# Patient Record
Sex: Female | Born: 1960 | Race: White | Hispanic: No | Marital: Married | Smoking: Never smoker
Health system: Southern US, Community
[De-identification: ages and names within clinical notes are randomized; demographics above are authoritative.]

## PROBLEM LIST (undated history)

## (undated) DIAGNOSIS — E785 Hyperlipidemia, unspecified: Secondary | ICD-10-CM

## (undated) DIAGNOSIS — C801 Malignant (primary) neoplasm, unspecified: Secondary | ICD-10-CM

## (undated) DIAGNOSIS — E041 Nontoxic single thyroid nodule: Secondary | ICD-10-CM

## (undated) DIAGNOSIS — M779 Enthesopathy, unspecified: Secondary | ICD-10-CM

## (undated) DIAGNOSIS — M8580 Other specified disorders of bone density and structure, unspecified site: Secondary | ICD-10-CM

## (undated) DIAGNOSIS — E039 Hypothyroidism, unspecified: Secondary | ICD-10-CM

## (undated) HISTORY — DX: Enthesopathy, unspecified: M77.9

## (undated) HISTORY — DX: Hypothyroidism, unspecified: E03.9

## (undated) HISTORY — DX: Hyperlipidemia, unspecified: E78.5

## (undated) HISTORY — DX: Nontoxic single thyroid nodule: E04.1

## (undated) HISTORY — DX: Other specified disorders of bone density and structure, unspecified site: M85.80

## (undated) HISTORY — DX: Malignant (primary) neoplasm, unspecified: C80.1

---

## 1994-09-13 HISTORY — PX: LIPOSUCTION: SHX10

## 1999-07-29 ENCOUNTER — Other Ambulatory Visit: Admission: RE | Admit: 1999-07-29 | Discharge: 1999-07-29 | Payer: Self-pay | Admitting: Obstetrics and Gynecology

## 2000-07-14 ENCOUNTER — Other Ambulatory Visit: Admission: RE | Admit: 2000-07-14 | Discharge: 2000-07-14 | Payer: Self-pay | Admitting: Obstetrics and Gynecology

## 2001-05-22 ENCOUNTER — Encounter: Payer: Self-pay | Admitting: Obstetrics and Gynecology

## 2001-05-22 ENCOUNTER — Encounter: Admission: RE | Admit: 2001-05-22 | Discharge: 2001-05-22 | Payer: Self-pay | Admitting: Obstetrics and Gynecology

## 2001-08-08 ENCOUNTER — Other Ambulatory Visit: Admission: RE | Admit: 2001-08-08 | Discharge: 2001-08-08 | Payer: Self-pay | Admitting: Obstetrics and Gynecology

## 2002-05-01 ENCOUNTER — Encounter: Admission: RE | Admit: 2002-05-01 | Discharge: 2002-05-01 | Payer: Self-pay | Admitting: Obstetrics and Gynecology

## 2002-05-01 ENCOUNTER — Encounter: Payer: Self-pay | Admitting: Obstetrics and Gynecology

## 2002-07-30 ENCOUNTER — Other Ambulatory Visit: Admission: RE | Admit: 2002-07-30 | Discharge: 2002-07-30 | Payer: Self-pay | Admitting: Obstetrics and Gynecology

## 2003-05-10 ENCOUNTER — Encounter: Admission: RE | Admit: 2003-05-10 | Discharge: 2003-05-10 | Payer: Self-pay | Admitting: Obstetrics and Gynecology

## 2003-05-10 ENCOUNTER — Encounter: Payer: Self-pay | Admitting: Obstetrics and Gynecology

## 2003-08-02 ENCOUNTER — Other Ambulatory Visit: Admission: RE | Admit: 2003-08-02 | Discharge: 2003-08-02 | Payer: Self-pay | Admitting: Obstetrics and Gynecology

## 2003-09-14 DIAGNOSIS — C801 Malignant (primary) neoplasm, unspecified: Secondary | ICD-10-CM

## 2003-09-14 HISTORY — PX: BASAL CELL CARCINOMA EXCISION: SHX1214

## 2003-09-14 HISTORY — DX: Malignant (primary) neoplasm, unspecified: C80.1

## 2004-05-15 ENCOUNTER — Encounter: Admission: RE | Admit: 2004-05-15 | Discharge: 2004-05-15 | Payer: Self-pay | Admitting: Obstetrics and Gynecology

## 2004-08-14 ENCOUNTER — Other Ambulatory Visit: Admission: RE | Admit: 2004-08-14 | Discharge: 2004-08-14 | Payer: Self-pay | Admitting: Obstetrics and Gynecology

## 2005-05-25 ENCOUNTER — Encounter: Admission: RE | Admit: 2005-05-25 | Discharge: 2005-05-25 | Payer: Self-pay | Admitting: Obstetrics and Gynecology

## 2005-08-18 ENCOUNTER — Other Ambulatory Visit: Admission: RE | Admit: 2005-08-18 | Discharge: 2005-08-18 | Payer: Self-pay | Admitting: Obstetrics and Gynecology

## 2006-06-01 ENCOUNTER — Encounter: Admission: RE | Admit: 2006-06-01 | Discharge: 2006-06-01 | Payer: Self-pay | Admitting: Obstetrics and Gynecology

## 2006-09-23 ENCOUNTER — Other Ambulatory Visit: Admission: RE | Admit: 2006-09-23 | Discharge: 2006-09-23 | Payer: Self-pay | Admitting: Obstetrics and Gynecology

## 2007-06-09 ENCOUNTER — Encounter: Admission: RE | Admit: 2007-06-09 | Discharge: 2007-06-09 | Payer: Self-pay | Admitting: Obstetrics and Gynecology

## 2007-10-11 ENCOUNTER — Other Ambulatory Visit: Admission: RE | Admit: 2007-10-11 | Discharge: 2007-10-11 | Payer: Self-pay | Admitting: Obstetrics and Gynecology

## 2008-06-14 ENCOUNTER — Encounter: Admission: RE | Admit: 2008-06-14 | Discharge: 2008-06-14 | Payer: Self-pay | Admitting: Obstetrics and Gynecology

## 2008-06-21 ENCOUNTER — Encounter: Admission: RE | Admit: 2008-06-21 | Discharge: 2008-06-21 | Payer: Self-pay | Admitting: Obstetrics and Gynecology

## 2008-10-22 ENCOUNTER — Other Ambulatory Visit: Admission: RE | Admit: 2008-10-22 | Discharge: 2008-10-22 | Payer: Self-pay | Admitting: Obstetrics & Gynecology

## 2008-12-27 ENCOUNTER — Encounter: Admission: RE | Admit: 2008-12-27 | Discharge: 2008-12-27 | Payer: Self-pay | Admitting: Obstetrics and Gynecology

## 2009-07-02 ENCOUNTER — Encounter: Admission: RE | Admit: 2009-07-02 | Discharge: 2009-07-02 | Payer: Self-pay | Admitting: Obstetrics and Gynecology

## 2010-07-08 ENCOUNTER — Encounter: Admission: RE | Admit: 2010-07-08 | Discharge: 2010-07-08 | Payer: Self-pay | Admitting: Obstetrics and Gynecology

## 2011-04-26 ENCOUNTER — Other Ambulatory Visit: Payer: Self-pay | Admitting: *Deleted

## 2011-04-26 ENCOUNTER — Other Ambulatory Visit: Payer: Self-pay | Admitting: Obstetrics and Gynecology

## 2011-04-26 DIAGNOSIS — Z1231 Encounter for screening mammogram for malignant neoplasm of breast: Secondary | ICD-10-CM

## 2011-07-12 ENCOUNTER — Ambulatory Visit
Admission: RE | Admit: 2011-07-12 | Discharge: 2011-07-12 | Disposition: A | Discharge status: Home / Self Care | Payer: BC Managed Care – PPO | Source: Ambulatory Visit | Attending: Obstetrics and Gynecology | Admitting: Obstetrics and Gynecology

## 2011-07-12 DIAGNOSIS — Z1231 Encounter for screening mammogram for malignant neoplasm of breast: Secondary | ICD-10-CM

## 2012-06-08 ENCOUNTER — Other Ambulatory Visit: Payer: Self-pay | Admitting: Obstetrics and Gynecology

## 2012-06-08 DIAGNOSIS — Z1231 Encounter for screening mammogram for malignant neoplasm of breast: Secondary | ICD-10-CM

## 2012-07-11 ENCOUNTER — Other Ambulatory Visit: Payer: Self-pay | Admitting: *Deleted

## 2012-07-11 DIAGNOSIS — Z780 Asymptomatic menopausal state: Secondary | ICD-10-CM

## 2012-07-11 DIAGNOSIS — M2550 Pain in unspecified joint: Secondary | ICD-10-CM

## 2012-07-11 DIAGNOSIS — E2839 Other primary ovarian failure: Secondary | ICD-10-CM

## 2012-07-14 ENCOUNTER — Ambulatory Visit
Admission: RE | Admit: 2012-07-14 | Discharge: 2012-07-14 | Disposition: A | Discharge status: Home / Self Care | Payer: BC Managed Care – PPO | Source: Ambulatory Visit | Attending: Obstetrics and Gynecology | Admitting: Obstetrics and Gynecology

## 2012-07-14 DIAGNOSIS — Z1231 Encounter for screening mammogram for malignant neoplasm of breast: Secondary | ICD-10-CM

## 2013-06-25 ENCOUNTER — Other Ambulatory Visit: Payer: Self-pay

## 2013-06-25 DIAGNOSIS — Z1231 Encounter for screening mammogram for malignant neoplasm of breast: Secondary | ICD-10-CM

## 2013-08-03 ENCOUNTER — Ambulatory Visit
Admission: RE | Admit: 2013-08-03 | Discharge: 2013-08-03 | Disposition: A | Discharge status: Home / Self Care | Payer: BC Managed Care – PPO | Source: Ambulatory Visit

## 2013-08-03 DIAGNOSIS — Z1231 Encounter for screening mammogram for malignant neoplasm of breast: Secondary | ICD-10-CM

## 2013-08-07 ENCOUNTER — Other Ambulatory Visit: Payer: Self-pay | Admitting: Obstetrics and Gynecology

## 2013-08-07 DIAGNOSIS — R928 Other abnormal and inconclusive findings on diagnostic imaging of breast: Secondary | ICD-10-CM

## 2013-08-23 ENCOUNTER — Ambulatory Visit
Admission: RE | Admit: 2013-08-23 | Discharge: 2013-08-23 | Disposition: A | Discharge status: Home / Self Care | Payer: BC Managed Care – PPO | Source: Ambulatory Visit | Attending: Obstetrics and Gynecology | Admitting: Obstetrics and Gynecology

## 2013-08-23 ENCOUNTER — Other Ambulatory Visit: Payer: Self-pay | Admitting: Obstetrics and Gynecology

## 2013-08-23 DIAGNOSIS — R928 Other abnormal and inconclusive findings on diagnostic imaging of breast: Secondary | ICD-10-CM

## 2013-11-07 ENCOUNTER — Encounter: Payer: Self-pay | Admitting: Certified Nurse Midwife

## 2013-11-13 ENCOUNTER — Ambulatory Visit (INDEPENDENT_AMBULATORY_CARE_PROVIDER_SITE_OTHER): Payer: BC Managed Care – PPO | Admitting: Certified Nurse Midwife

## 2013-11-13 ENCOUNTER — Encounter: Payer: Self-pay | Admitting: Certified Nurse Midwife

## 2013-11-13 VITALS — BP 126/82 | HR 65 | Resp 18 | Ht 61.0 in | Wt 150.0 lb

## 2013-11-13 DIAGNOSIS — Z0000 Encounter for general adult medical examination without abnormal findings: Secondary | ICD-10-CM

## 2013-11-13 DIAGNOSIS — Z01419 Encounter for gynecological examination (general) (routine) without abnormal findings: Secondary | ICD-10-CM

## 2013-11-13 DIAGNOSIS — E039 Hypothyroidism, unspecified: Secondary | ICD-10-CM | POA: Insufficient documentation

## 2013-11-13 LAB — POCT URINALYSIS DIPSTICK
BILIRUBIN UA: NEGATIVE
GLUCOSE UA: NEGATIVE
Ketones, UA: NEGATIVE
Nitrite, UA: NEGATIVE
PROTEIN UA: NEGATIVE
RBC UA: NEGATIVE
UROBILINOGEN UA: NEGATIVE
pH, UA: 5

## 2013-11-13 NOTE — Progress Notes (Signed)
Reviewed personally.  M. Suzanne Josee Speece, MD.  

## 2013-11-13 NOTE — Patient Instructions (Signed)

## 2013-11-13 NOTE — Progress Notes (Signed)
53 y.o. G0P0000 Married Caucasian Fe here for annual exam. Menopausal no HRT. Denies vaginal bleeding or vaginal dryness. Recent labs with elevated Cholesterol , patient to have  Fasting done with PCP management soon. Patients brother diagnosed with rare Pancreatic/ and  Colon cancer, in treatment now. Patient doing "OK" emotionally. No health issues today.  No LMP recorded. Patient is postmenopausal.          Sexually active: no  The current method of family planning is none.    Exercising: yes  Running, walking, weights Smoker:  no  Health Maintenance: Pap:  10/2011 - normal MMG:  07/2013 normal after follow up on right negative  Density  Category B  Colonoscopy:  04/2011 - Going back this year - Polyps  BMD:   Never TDaP:  01/2013 Labs: PCP   reports that she has never smoked. She has never used smokeless tobacco. She reports that she drinks about 8.4 ounces of alcohol per week. She reports that she does not use illicit drugs.  Past Medical History  Diagnosis Date  . Cancer 2005    basal cell upper back    Past Surgical History  Procedure Laterality Date  . Liposuction  1996    hips  . Basal cell carcinoma excision  2005    left side upper back    Current Outpatient Prescriptions  Medication Sig Dispense Refill  . CALCIUM PO Take 600 mg by mouth daily.       . IRON PO Take by mouth daily.      Marland Kitchen levothyroxine (SYNTHROID, LEVOTHROID) 88 MCG tablet Take 1 tablet by mouth daily.      . Multiple Vitamins-Minerals (HAIR/SKIN/NAILS/BIOTIN PO) Take by mouth daily.      . Multiple Vitamins-Minerals (MULTIVITAMIN PO) Take by mouth daily.      Marland Kitchen triamterene-hydrochlorothiazide (MAXZIDE-25) 37.5-25 MG per tablet Take 1 tablet by mouth daily.      . Echinacea 400 MG CAPS Take by mouth as needed.       No current facility-administered medications for this visit.    Family History  Problem Relation Age of Onset  . Heart disease Mother     bypass  . Cancer Father     Prostate   . Cancer Brother     Brain, Prostate  . Cancer Brother     Liver and Colon    ROS:  Pertinent items are noted in HPI.  Otherwise, a comprehensive ROS was negative.  Exam:   BP 126/82  Pulse 65  Resp 18  Ht 5\' 1"  (1.549 m)  Wt 150 lb (68.04 kg)  BMI 28.36 kg/m2 Height: 5\' 1"  (154.9 cm)  Ht Readings from Last 3 Encounters:  11/13/13 5\' 1"  (1.549 m)    General appearance: alert, cooperative and appears stated age Head: Normocephalic, without obvious abnormality, atraumatic Neck: no adenopathy, supple, symmetrical, trachea midline and thyroid normal to inspection and palpation and non-palpable Lungs: clear to auscultation bilaterally Breasts: normal appearance, no masses or tenderness, No nipple retraction or dimpling, No nipple discharge or bleeding, No axillary or supraclavicular adenopathy Heart: regular rate and rhythm Abdomen: soft, non-tender; no masses,  no organomegaly Extremities: extremities normal, atraumatic, no cyanosis or edema Skin: Skin color, texture, turgor normal. No rashes or lesions Lymph nodes: Cervical, supraclavicular, and axillary nodes normal. No abnormal inguinal nodes palpated Neurologic: Grossly normal   Pelvic: External genitalia:  no lesions              Urethra:  normal  appearing urethra with no masses, tenderness or lesions              Bartholin's and Skene's: normal                 Vagina: normal appearing vagina with normal color and discharge, no lesions              Cervix: normal appearance, non tender              Pap taken: yes Bimanual Exam:  Uterus:  normal size, contour, position, consistency, mobility, non-tender and mid position              Adnexa: normal adnexa and no mass, fullness, tenderness               Rectovaginal: Confirms               Anus:  normal sphincter tone, no lesions  A:  Well Woman with normal exam  Menopausal no HRT  Social Stress with brothers cancer  Hypothyroid stable medication with PCP  management  P:   Reviewed health and wellness pertinent to exam  Aware of need to evaluate if vaginal bleeding  Discussed seeking family and friend support  Continue follow up as indciated  Pap smear as per guidelines   Mammogram yearly pap smear taken today with reflex  counseled on breast self exam, mammography screening, adequate intake of calcium and vitamin D, diet and exercise Colonoscopy due this year due to polyps, has scheduled return annually or prn  An After Visit Summary was printed and given to the patient.

## 2013-11-15 LAB — IPS PAP TEST WITH REFLEX TO HPV

## 2014-07-19 ENCOUNTER — Other Ambulatory Visit: Payer: Self-pay

## 2014-07-19 DIAGNOSIS — Z1231 Encounter for screening mammogram for malignant neoplasm of breast: Secondary | ICD-10-CM

## 2014-08-16 ENCOUNTER — Encounter (INDEPENDENT_AMBULATORY_CARE_PROVIDER_SITE_OTHER): Payer: Self-pay

## 2014-08-16 ENCOUNTER — Ambulatory Visit
Admission: RE | Admit: 2014-08-16 | Discharge: 2014-08-16 | Disposition: A | Discharge status: Home / Self Care | Payer: BC Managed Care – PPO | Source: Ambulatory Visit

## 2014-08-16 ENCOUNTER — Ambulatory Visit: Payer: BC Managed Care – PPO

## 2014-08-16 DIAGNOSIS — Z1231 Encounter for screening mammogram for malignant neoplasm of breast: Secondary | ICD-10-CM

## 2014-11-15 ENCOUNTER — Encounter: Payer: Self-pay | Admitting: Certified Nurse Midwife

## 2014-11-15 ENCOUNTER — Ambulatory Visit (INDEPENDENT_AMBULATORY_CARE_PROVIDER_SITE_OTHER): Payer: BC Managed Care – PPO | Admitting: Certified Nurse Midwife

## 2014-11-15 VITALS — BP 120/78 | HR 70 | Resp 16 | Ht 60.5 in | Wt 152.0 lb

## 2014-11-15 DIAGNOSIS — Z01419 Encounter for gynecological examination (general) (routine) without abnormal findings: Secondary | ICD-10-CM | POA: Diagnosis not present

## 2014-11-15 DIAGNOSIS — Z0000 Encounter for general adult medical examination without abnormal findings: Secondary | ICD-10-CM | POA: Diagnosis not present

## 2014-11-15 LAB — POCT URINALYSIS DIPSTICK
BILIRUBIN UA: NEGATIVE
Blood, UA: NEGATIVE
GLUCOSE UA: NEGATIVE
KETONES UA: NEGATIVE
Leukocytes, UA: NEGATIVE
Nitrite, UA: NEGATIVE
Protein, UA: NEGATIVE
Urobilinogen, UA: NEGATIVE
pH, UA: 5

## 2014-11-15 NOTE — Patient Instructions (Signed)

## 2014-11-15 NOTE — Progress Notes (Signed)
54 y.o. G0P0000 Married  Caucasian Fe here for annual exam. Menopausal no HRT. Denies vaginal bleeding/vaginal dryness. Sees PCP for aex/labs and Hypothyroid management. Social stress with mother diagnosis with cancer. She has now moved her into her home. Has sibling support and church support. No other health issues.  Patient's last menstrual period was 01/12/2011.          Sexually active: No.  The current method of family planning is post menopausal status.    Exercising: Yes.    walking,run,weights,yoga Smoker:  no  Health Maintenance: Pap:  11-13-13 neg MMG: 08-16-14 category b density,birads 1:neg Colonoscopy: 9/15 f/u 55yrs per patient BMD:   none TDaP:  5/14 Labs: Poct urine-neg Self breast exam: done monthly   reports that she has never smoked. She has never used smokeless tobacco. She reports that she drinks about 4.8 oz of alcohol per week. She reports that she does not use illicit drugs.  Past Medical History  Diagnosis Date  . Cancer 2005    basal cell upper back    Past Surgical History  Procedure Laterality Date  . Liposuction  1996    hips  . Basal cell carcinoma excision  2005    left side upper back    Current Outpatient Prescriptions  Medication Sig Dispense Refill  . levothyroxine (SYNTHROID, LEVOTHROID) 88 MCG tablet Take 1 tablet by mouth daily.    . Multiple Vitamins-Minerals (HAIR/SKIN/NAILS/BIOTIN PO) Take by mouth daily.    . Multiple Vitamins-Minerals (MULTIVITAMIN PO) Take by mouth daily.    Marland Kitchen triamterene-hydrochlorothiazide (MAXZIDE-25) 37.5-25 MG per tablet Take 1 tablet by mouth as needed.      No current facility-administered medications for this visit.    Family History  Problem Relation Age of Onset  . Heart disease Mother     bypass  . Cancer Father     Prostate  . Cancer Brother     Brain, Prostate  . Cancer Brother     Liver and Colon    ROS:  Pertinent items are noted in HPI.  Otherwise, a comprehensive ROS was  negative.  Exam:   BP 120/78 mmHg  Pulse 70  Resp 16  Ht 5' 0.5" (1.537 m)  Wt 152 lb (68.947 kg)  BMI 29.19 kg/m2  LMP 01/12/2011 Height: 5' 0.5" (153.7 cm) Ht Readings from Last 3 Encounters:  11/15/14 5' 0.5" (1.537 m)  11/13/13 5\' 1"  (1.549 m)    General appearance: alert, cooperative and appears stated age Head: Normocephalic, without obvious abnormality, atraumatic Neck: no adenopathy, supple, symmetrical, trachea midline and thyroid normal to inspection and palpation Lungs: clear to auscultation bilaterally Breasts: normal appearance, no masses or tenderness, No nipple retraction or dimpling, No nipple discharge or bleeding, No axillary or supraclavicular adenopathy Heart: regular rate and rhythm Abdomen: soft, non-tender; no masses,  no organomegaly Extremities: extremities normal, atraumatic, no cyanosis or edema Skin: Skin color, texture, turgor normal. No rashes or lesions Lymph nodes: Cervical, supraclavicular, and axillary nodes normal. No abnormal inguinal nodes palpated Neurologic: Grossly normal   Pelvic: External genitalia:  no lesions              Urethra:  normal appearing urethra with no masses, tenderness or lesions              Bartholin's and Skene's: normal                 Vagina: normal appearing vagina with normal color and discharge, no lesions  Cervix: normal, non tender, no lesions              Pap taken: No. Bimanual Exam:  Uterus:  normal size, contour, position, consistency, mobility, non-tender              Adnexa: normal adnexa and no mass, fullness, tenderness               Rectovaginal: Confirms               Anus:  normal sphincter tone, no lesions  Chaperone present: Yes  A:  Well Woman with normal exam  Menopausal no HRT  Hypothyroid with PCP management  BMD due  Family history of colon cancer patient colonoscopy negative  Social stress with mother with cancer colon  P:   Reviewed health and wellness pertinent to  exam  Aware of need to evaluate if vaginal bleeding  Continue follow up with MD as recommended  Discussed having bone density this year with now 5 years out from menopause and hypothyroid. Patient will call and schedule. Discussed importance of exercise and and Vitamin D( checked at PCP).  Stressed time for self while caring for mother and seek support from family as needed.  Pap smear not taken today   counseled on breast self exam, mammography screening, menopause, adequate intake of calcium and vitamin D, diet and exercise  return annually or prn  An After Visit Summary was printed and given to the patient.

## 2014-11-19 NOTE — Progress Notes (Signed)
Reviewed personally.  M. Suzanne Winferd Wease, MD.  

## 2015-06-23 ENCOUNTER — Other Ambulatory Visit: Payer: Self-pay

## 2015-06-23 DIAGNOSIS — Z1231 Encounter for screening mammogram for malignant neoplasm of breast: Secondary | ICD-10-CM

## 2015-08-18 ENCOUNTER — Ambulatory Visit
Admission: RE | Admit: 2015-08-18 | Discharge: 2015-08-18 | Disposition: A | Discharge status: Home / Self Care | Payer: BC Managed Care – PPO | Source: Ambulatory Visit

## 2015-08-18 DIAGNOSIS — Z1231 Encounter for screening mammogram for malignant neoplasm of breast: Secondary | ICD-10-CM

## 2015-11-18 ENCOUNTER — Ambulatory Visit (INDEPENDENT_AMBULATORY_CARE_PROVIDER_SITE_OTHER): Payer: BC Managed Care – PPO | Admitting: Certified Nurse Midwife

## 2015-11-18 ENCOUNTER — Encounter: Payer: Self-pay | Admitting: Certified Nurse Midwife

## 2015-11-18 VITALS — BP 112/80 | HR 70 | Resp 16 | Ht 60.25 in | Wt 152.0 lb

## 2015-11-18 DIAGNOSIS — Z01419 Encounter for gynecological examination (general) (routine) without abnormal findings: Secondary | ICD-10-CM | POA: Diagnosis not present

## 2015-11-18 DIAGNOSIS — Z124 Encounter for screening for malignant neoplasm of cervix: Secondary | ICD-10-CM | POA: Diagnosis not present

## 2015-11-18 NOTE — Patient Instructions (Signed)

## 2015-11-18 NOTE — Progress Notes (Signed)
55 y.o. G0P0000 Married  Caucasian Fe here for annual exam. Menopausal no HRT. Denies vaginal bleeding or vaginal dryness.  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 Is treating with Yoga and proper stance. Sees PCP yearly aex/labs/hypothyroid management. No other health issues today.  Patient's last menstrual period was 01/12/2011.          Sexually active: No.  The current method of family planning is none, post menopausal    Exercising: Yes.    walking, stairclimber, simply fit board,yoga Smoker:  no  Health Maintenance: Pap:  11-13-13 neg MMG:  08-18-15 category b density,birads 1:neg Colonoscopy: 9/15 f/u 70yrs BMD:   none TDaP:  5/14 Shingles: not done Pneumonia: not done2 Hep C and HIV: will do at PCP Labs:pcp  Self breast exam: done occ   reports that she has never smoked. She has never used smokeless tobacco. She reports that she drinks about 4.8 - 6.0 oz of alcohol per week. She reports that she does not use illicit drugs.  Past Medical History  Diagnosis Date  . Cancer ([place]) 2005    basal cell upper back    Past Surgical History  Procedure Laterality Date  . Liposuction  1996    hips  . Basal cell carcinoma excision  2005    left side upper back    Current Outpatient Prescriptions  Medication Sig Dispense Refill  . levothyroxine (SYNTHROID, LEVOTHROID) 88 MCG tablet Take 1 tablet by mouth daily.    . Multiple Vitamins-Minerals (HAIR/SKIN/NAILS/BIOTIN PO) Take by mouth daily.    . Multiple Vitamins-Minerals (MULTIVITAMIN PO) Take by mouth daily.    Marland Kitchen UNABLE TO FIND [facility] generic diuretic every other day    . triamterene-hydrochlorothiazide (MAXZIDE-25) 37.5-25 MG per tablet Take 1 tablet by mouth as needed. Reported on 11/18/2015     No current facility-administered medications for this visit.    Family History  Problem Relation Age of Onset  . Heart disease Mother     bypass  . Stroke Mother     minor stroke 08-19-14  . Cancer Father      Prostate  . Cancer Brother     Brain, Prostate  . Cancer Brother     Liver and Colon    ROS:  Pertinent items are noted in HPI.  Otherwise, a comprehensive ROS was negative.  Exam:   BP 112/80 mmHg  Pulse 70  Resp 16  Ht 5' 0.25" (1.53 m)  Wt 152 lb (68.947 kg)  BMI 29.45 kg/m2  LMP 01/12/2011 Height: 5' 0.25" (153 cm) Ht Readings from Last 3 Encounters:  11/18/15 5' 0.25" (1.53 m)  11/15/14 5' 0.5" (1.537 m)  11/13/13 5\' 1"  (1.549 m)    General appearance: alert, cooperative and appears stated age Head: Normocephalic, without obvious abnormality, atraumatic Neck: no adenopathy, supple, symmetrical, trachea midline and thyroid normal to inspection and palpation Lungs: clear to auscultation bilaterally Breasts: normal appearance, no masses or tenderness, No nipple retraction or dimpling, No nipple discharge or bleeding, No axillary or supraclavicular adenopathy Heart: regular rate and rhythm Abdomen: soft, non-tender; no masses,  no organomegaly Extremities: extremities normal, atraumatic, no cyanosis or edema Skin: Skin color, texture, turgor normal. No rashes or lesions Lymph nodes: Cervical, supraclavicular, and axillary nodes normal. No abnormal inguinal nodes palpated Neurologic: Grossly normal   Pelvic: External genitalia:  no lesions              Urethra:  normal appearing urethra with no masses,  tenderness or lesions              Bartholin's and Skene's: normal                 Vagina: normal appearing vagina with normal color and discharge, no lesions              Cervix: normal,non tender, no lesions              Pap taken: Yes.   Bimanual Exam:  Uterus:  normal size, contour, position, consistency, mobility, non-tender              Adnexa: normal adnexa and no mass, fullness, tenderness               Rectovaginal: Confirms               Anus:  normal sphincter tone, no lesions  Chaperone present: yes  A:  Well Woman with normal exam  Menopausal no  HRT  Vaginal dryness  Hypothyroid management with PCP  P:   Reviewed health and wellness pertinent to exam  Aware of need to evaluate if vaginal bleeding  Discussed coconut oil use for vaginal dryness. Instructions given for use. Will advise if any change.  Continue follow up as indicated by MD  Pap smear as above with HPVHR   counseled on breast self exam, mammography screening, menopause, adequate intake of calcium and vitamin D, diet and exercise  return annually or prn  An After Visit Summary was printed and given to the patient.

## 2015-11-23 NOTE — Progress Notes (Signed)
Encounter reviewed Gyasi Hazzard, MD   

## 2015-11-25 LAB — IPS PAP TEST WITH HPV

## 2016-07-16 ENCOUNTER — Other Ambulatory Visit: Payer: Self-pay | Admitting: Certified Nurse Midwife

## 2016-07-16 DIAGNOSIS — Z1231 Encounter for screening mammogram for malignant neoplasm of breast: Secondary | ICD-10-CM

## 2016-08-24 ENCOUNTER — Ambulatory Visit
Admission: RE | Admit: 2016-08-24 | Discharge: 2016-08-24 | Disposition: A | Discharge status: Home / Self Care | Payer: BC Managed Care – PPO | Source: Ambulatory Visit | Attending: Certified Nurse Midwife | Admitting: Certified Nurse Midwife

## 2016-08-24 DIAGNOSIS — Z1231 Encounter for screening mammogram for malignant neoplasm of breast: Secondary | ICD-10-CM

## 2016-11-18 ENCOUNTER — Encounter: Payer: Self-pay | Admitting: Certified Nurse Midwife

## 2016-11-18 ENCOUNTER — Ambulatory Visit: Payer: BC Managed Care – PPO | Admitting: Certified Nurse Midwife

## 2016-11-18 VITALS — BP 118/80 | HR 70 | Resp 16 | Ht 60.25 in | Wt 154.0 lb

## 2016-11-18 DIAGNOSIS — Z01419 Encounter for gynecological examination (general) (routine) without abnormal findings: Secondary | ICD-10-CM

## 2016-11-18 DIAGNOSIS — N951 Menopausal and female climacteric states: Secondary | ICD-10-CM | POA: Diagnosis not present

## 2016-11-18 NOTE — Patient Instructions (Signed)

## 2016-11-18 NOTE — Progress Notes (Signed)
Encounter reviewed Mina Babula, MD   

## 2016-11-18 NOTE — Progress Notes (Addendum)
56 y.o. G0P0000 Married  Caucasian Fe here for annual exam. Menopausal no HRT. Denies vaginal bleeding or vaginal dryness. Sees PCP Dr. Aline August every 6 months for aex/labs and hypothyroid management. Has had decrease in thyroid medication and doing well with change. No other health issues today. Going to [place] for vacation.  Patient's last menstrual period was 01/12/2011.          Sexually active: No.  The current method of family planning is post menopausal status.    Exercising: Yes.    run, stair climber, yoga, walk Smoker:  no  Health Maintenance: Pap:  11-13-13 neg, 11-18-15 neg HPV HR neg MMG:  08-24-16 category b density birads 1:neg Colonoscopy:  9/15 f/u 7yrs BMD:   06/16/17 Osteopenia noted on  Screening PCP management with instructions to up calcium /vitamin D to 1200 mg daily TDaP:  2014 Shingles: no Pneumonia: no Hep C and HIV: Hep c neg 2017 Labs: pcp  Self breast exam: done occ   reports that she has never smoked. She has never used smokeless tobacco. She reports that she drinks about 4.8 - 6.0 oz of alcohol per week . She reports that she does not use drugs.  Past Medical History:  Diagnosis Date  . Cancer ([place]) 2005   basal cell upper back  . Hypothyroid   . Tendonitis     Past Surgical History:  Procedure Laterality Date  . BASAL CELL CARCINOMA EXCISION  2005   left side upper back  . LIPOSUCTION  1996   hips    Current Outpatient Prescriptions  Medication Sig Dispense Refill  . levothyroxine (SYNTHROID, LEVOTHROID) 50 MCG tablet     . Multiple Vitamins-Minerals (HAIR/SKIN/NAILS/BIOTIN PO) Take by mouth daily.    . Multiple Vitamins-Minerals (MULTIVITAMIN PO) Take by mouth daily.     No current facility-administered medications for this visit.     Family History  Problem Relation Age of Onset  . Heart disease Mother     bypass  . Stroke Mother     minor stroke 08-19-14  . Cancer Father     Prostate  . Cancer Brother     Brain, Prostate  .  Cancer Brother     Liver and Colon    ROS:  Pertinent items are noted in HPI.  Otherwise, a comprehensive ROS was negative.  Exam:   BP 118/80   Pulse 70   Resp 16   Ht 5' 0.25" (1.53 m)   Wt 154 lb (69.9 kg)   LMP 01/12/2011   BMI 29.83 kg/m  Height: 5' 0.25" (153 cm) Ht Readings from Last 3 Encounters:  11/18/16 5' 0.25" (1.53 m)  11/18/15 5' 0.25" (1.53 m)  11/15/14 5' 0.5" (1.537 m)    General appearance: alert, cooperative and appears stated age Head: Normocephalic, without obvious abnormality, atraumatic Neck: no adenopathy, supple, symmetrical, trachea midline and thyroid normal to inspection and palpation Lungs: clear to auscultation bilaterally Breasts: normal appearance, no masses or tenderness, No nipple retraction or dimpling, No nipple discharge or bleeding, No axillary or supraclavicular adenopathy Heart: regular rate and rhythm Abdomen: soft, non-tender; no masses,  no organomegaly Extremities: extremities normal, atraumatic, no cyanosis or edema Skin: Skin color, texture, turgor normal. No rashes or lesions Lymph nodes: Cervical, supraclavicular, and axillary nodes normal. No abnormal inguinal nodes palpated Neurologic: Grossly normal   Pelvic: External genitalia:  no lesions              Urethra:  normal appearing urethra  with no masses, tenderness or lesions              Bartholin's and Skene's: normal                 Vagina: normal appearing vagina with normal color and discharge, no lesions              Cervix: no cervical motion tenderness and no lesions              Pap taken: No. Bimanual Exam:  Uterus:  normal size, contour, position, consistency, mobility, non-tender              Adnexa: normal adnexa and no mass, fullness, tenderness               Rectovaginal: Confirms               Anus:  normal sphincter tone, no lesions  Chaperone present: yes  A:  Well Woman with normal exam  Menopausal no HRT  Hypothyroid with PCP management    P:    Reviewed health and wellness pertinent to exam  Aware of need to advise if vaginal bleeding  Continue MD follow up as indicated  BMD due  Patient will schedule with mammogram  Pap smear as above not taken   counseled on breast self exam, mammography screening, feminine hygiene, adequate intake of calcium and vitamin D, diet and exercise  return annually or prn  An After Visit Summary was printed and given to the patient.

## 2017-02-09 ENCOUNTER — Encounter: Payer: Self-pay | Admitting: Certified Nurse Midwife

## 2017-06-03 ENCOUNTER — Other Ambulatory Visit: Payer: Self-pay | Admitting: Certified Nurse Midwife

## 2017-06-03 DIAGNOSIS — Z1231 Encounter for screening mammogram for malignant neoplasm of breast: Secondary | ICD-10-CM

## 2017-08-25 ENCOUNTER — Ambulatory Visit
Admission: RE | Admit: 2017-08-25 | Discharge: 2017-08-25 | Disposition: A | Discharge status: Home / Self Care | Payer: BC Managed Care – PPO | Source: Ambulatory Visit | Attending: Certified Nurse Midwife | Admitting: Certified Nurse Midwife

## 2017-08-25 DIAGNOSIS — Z1231 Encounter for screening mammogram for malignant neoplasm of breast: Secondary | ICD-10-CM

## 2017-11-22 ENCOUNTER — Other Ambulatory Visit: Payer: Self-pay

## 2017-11-22 ENCOUNTER — Ambulatory Visit: Payer: BC Managed Care – PPO | Admitting: Certified Nurse Midwife

## 2017-11-22 ENCOUNTER — Encounter: Payer: Self-pay | Admitting: Certified Nurse Midwife

## 2017-11-22 VITALS — BP 122/82 | HR 72 | Resp 16 | Ht 60.25 in | Wt 150.0 lb

## 2017-11-22 DIAGNOSIS — Z01419 Encounter for gynecological examination (general) (routine) without abnormal findings: Secondary | ICD-10-CM | POA: Diagnosis not present

## 2017-11-22 DIAGNOSIS — N951 Menopausal and female climacteric states: Secondary | ICD-10-CM

## 2017-11-22 DIAGNOSIS — Z659 Problem related to unspecified psychosocial circumstances: Secondary | ICD-10-CM | POA: Diagnosis not present

## 2017-11-22 NOTE — Patient Instructions (Signed)

## 2017-11-22 NOTE — Progress Notes (Addendum)
57 y.o. G0P0000 Married  Caucasian Fe here for annual exam. Denies vaginal bleeding or vaginal dryness. Seeing PCP Dr. Aline August for aex/labs/hypothyroid management every 6 months. Mother died last 2023/03/04, emotionally doing better now. Father now in "good" assisted living. Still exercising with yoga,walking, weights. Ready to take a vacation now. No other health issues today.  Patient's last menstrual period was 01/12/2011.          Sexually active: No.  The current method of family planning is post menopausal status.    Exercising: Yes.    walking, yoga, weights Smoker:  no  Health Maintenance: Pap:  11-18-15 neg HPV HR neg History of Abnormal Pap: no MMG:  08-25-17 category b density birads 1:neg Self Breast exams: yes Colonoscopy:  9/15 f/u 12yrs BMD:   06/16/17  TDaP:  2017 Shingles: no Pneumonia: no Hep C and HIV: Hep c neg 2017 Labs: with PCP   reports that  has never smoked. she has never used smokeless tobacco. She reports that she drinks about 4.8 - 6.0 oz of alcohol per week. She reports that she does not use drugs.  Past Medical History:  Diagnosis Date  . Cancer ([place]) 2005   basal cell upper back  . Hypothyroid   . Tendonitis     Past Surgical History:  Procedure Laterality Date  . BASAL CELL CARCINOMA EXCISION  2005   left side upper back  . LIPOSUCTION  1996   hips    Current Outpatient Medications  Medication Sig Dispense Refill  . levothyroxine (SYNTHROID, LEVOTHROID) 50 MCG tablet     . Multiple Vitamins-Minerals (HAIR/SKIN/NAILS/BIOTIN PO) Take by mouth daily.    . Multiple Vitamins-Minerals (MULTIVITAMIN PO) Take by mouth daily.    Marland Kitchen UNABLE TO FIND [facility] generic diuretic qod     No current facility-administered medications for this visit.     Family History  Problem Relation Age of Onset  . Heart disease Mother        bypass  . Stroke Mother        minor stroke 08-19-14  . Cancer Father        Prostate  . Cancer Brother        Brain, Prostate  .  Cancer Brother        Liver and Colon  . Breast cancer Paternal Uncle     ROS:  Pertinent items are noted in HPI.  Otherwise, a comprehensive ROS was negative.  Exam:   LMP 01/12/2011    Ht Readings from Last 3 Encounters:  11/18/16 5' 0.25" (1.53 m)  11/18/15 5' 0.25" (1.53 m)  11/15/14 5' 0.5" (1.537 m)    General appearance: alert, cooperative and appears stated age Head: Normocephalic, without obvious abnormality, atraumatic Neck: no adenopathy, supple, symmetrical, trachea midline and thyroid normal to inspection and palpation Lungs: clear to auscultation bilaterally Breasts: normal appearance, no masses or tenderness, No nipple retraction or dimpling, No nipple discharge or bleeding, No axillary or supraclavicular adenopathy Heart: regular rate and rhythm Abdomen: soft, non-tender; no masses,  no organomegaly Extremities: extremities normal, atraumatic, no cyanosis or edema Skin: Skin color, texture, turgor normal. No rashes or lesions Lymph nodes: Cervical, supraclavicular, and axillary nodes normal. No abnormal inguinal nodes palpated Neurologic: Grossly normal   Pelvic: External genitalia:  no lesions              Urethra:  normal appearing urethra with no masses, tenderness or lesions  Bartholin's and Skene's: normal                 Vagina: normal appearing vagina with normal color and discharge, no lesions              Cervix: no cervical motion tenderness, no lesions and normal appearance              Pap taken: No. Bimanual Exam:  Uterus:  normal size, contour, position, consistency, mobility, non-tender              Adnexa: normal adnexa and no mass, fullness, tenderness               Rectovaginal: Confirms               Anus:  normal sphincter tone, no lesions  Chaperone present: yes  A:  Well Woman with normal exam  Menopausal no HRT  Hypothyroid with PCP management   Social stress with death of mother  P:   Reviewed health and wellness  pertinent to exam  Aware of need to evaluate if vaginal bleeding  Continue to follow up with PCP as indicated.  Encouraged to seek support with family and friends as needed.  Pap smear: no   counseled on mammography screening, feminine hygiene, menopause, adequate intake of calcium and vitamin D, diet and exercise  return annually or prn  An After Visit Summary was printed and given to the patient.

## 2018-01-17 ENCOUNTER — Other Ambulatory Visit: Payer: Self-pay | Admitting: Family Medicine

## 2018-01-17 DIAGNOSIS — Z1231 Encounter for screening mammogram for malignant neoplasm of breast: Secondary | ICD-10-CM

## 2018-08-28 ENCOUNTER — Ambulatory Visit
Admission: RE | Admit: 2018-08-28 | Discharge: 2018-08-28 | Disposition: A | Discharge status: Home / Self Care | Payer: BC Managed Care – PPO | Source: Ambulatory Visit | Attending: Family Medicine | Admitting: Family Medicine

## 2018-08-28 DIAGNOSIS — Z1231 Encounter for screening mammogram for malignant neoplasm of breast: Secondary | ICD-10-CM

## 2018-09-21 ENCOUNTER — Other Ambulatory Visit: Payer: Self-pay | Admitting: Family Medicine

## 2018-09-21 DIAGNOSIS — Z1231 Encounter for screening mammogram for malignant neoplasm of breast: Secondary | ICD-10-CM

## 2018-11-24 ENCOUNTER — Ambulatory Visit: Payer: BC Managed Care – PPO | Admitting: Certified Nurse Midwife

## 2018-11-28 ENCOUNTER — Other Ambulatory Visit: Payer: Self-pay

## 2018-11-28 ENCOUNTER — Ambulatory Visit: Payer: BC Managed Care – PPO | Admitting: Certified Nurse Midwife

## 2018-11-28 ENCOUNTER — Other Ambulatory Visit (HOSPITAL_COMMUNITY)
Admission: RE | Admit: 2018-11-28 | Discharge: 2018-11-28 | Disposition: A | Discharge status: Home / Self Care | Payer: BC Managed Care – PPO | Source: Ambulatory Visit | Attending: Certified Nurse Midwife | Admitting: Certified Nurse Midwife

## 2018-11-28 ENCOUNTER — Encounter: Payer: Self-pay | Admitting: Certified Nurse Midwife

## 2018-11-28 VITALS — BP 118/76 | HR 68 | Resp 16 | Ht 60.5 in | Wt 159.0 lb

## 2018-11-28 DIAGNOSIS — Z124 Encounter for screening for malignant neoplasm of cervix: Secondary | ICD-10-CM

## 2018-11-28 DIAGNOSIS — N952 Postmenopausal atrophic vaginitis: Secondary | ICD-10-CM | POA: Diagnosis not present

## 2018-11-28 DIAGNOSIS — Z01411 Encounter for gynecological examination (general) (routine) with abnormal findings: Secondary | ICD-10-CM

## 2018-11-28 NOTE — Patient Instructions (Signed)
EXERCISE AND DIET:  We recommended that you start or continue a regular exercise program for good health. Regular exercise means any activity that makes your heart beat faster and makes you sweat.  We recommend exercising at least 30 minutes per day at least 3 days a week, preferably 4 or 5.  We also recommend a diet low in fat and sugar.  Inactivity, poor dietary choices and obesity can cause diabetes, heart attack, stroke, and kidney damage, among others.   ° °ALCOHOL AND SMOKING:  Women should limit their alcohol intake to no more than 7 drinks/beers/glasses of wine (combined, not each!) per week. Moderation of alcohol intake to this level decreases your risk of breast cancer and liver damage. And of course, no recreational drugs are part of a healthy lifestyle.  And absolutely no smoking or even second hand smoke. Most people know smoking can cause heart and lung diseases, but did you know it also contributes to weakening of your bones? Aging of your skin?  Yellowing of your teeth and nails? ° °CALCIUM AND VITAMIN D:  Adequate intake of calcium and Vitamin D are recommended.  The recommendations for exact amounts of these supplements seem to change often, but generally speaking 600 mg of calcium (either carbonate or citrate) and 800 units of Vitamin D per day seems prudent. Certain women may benefit from higher intake of Vitamin D.  If you are among these women, your doctor will have told you during your visit.   ° °PAP SMEARS:  Pap smears, to check for cervical cancer or precancers,  have traditionally been done yearly, although recent scientific advances have shown that most women can have pap smears less often.  However, every woman still should have a physical exam from her gynecologist every year. It will include a breast check, inspection of the vulva and vagina to check for abnormal growths or skin changes, a visual exam of the cervix, and then an exam to evaluate the size and shape of the uterus and  ovaries.  And after 59 years of age, a rectal exam is indicated to check for rectal cancers. We will also provide age appropriate advice regarding health maintenance, like when you should have certain vaccines, screening for sexually transmitted diseases, bone density testing, colonoscopy, mammograms, etc.  ° °MAMMOGRAMS:  All women over 40 years old should have a yearly mammogram. Many facilities now offer a "3D" mammogram, which may cost around $50 extra out of pocket. If possible,  we recommend you accept the option to have the 3D mammogram performed.  It both reduces the number of women who will be called back for extra views which then turn out to be normal, and it is better than the routine mammogram at detecting truly abnormal areas.   ° °COLONOSCOPY:  Colonoscopy to screen for colon cancer is recommended for all women at age 50.  We know, you hate the idea of the prep.  We agree, BUT, having colon cancer and not knowing it is worse!!  Colon cancer so often starts as a polyp that can be seen and removed at colonscopy, which can quite literally save your life!  And if your first colonoscopy is normal and you have no family history of colon cancer, most women don't have to have it again for 10 years.  Once every ten years, you can do something that may end up saving your life, right?  We will be happy to help you get it scheduled when you are ready.    Be sure to check your insurance coverage so you understand how much it will cost.  It may be covered as a preventative service at no cost, but you should check your particular policy.      Atrophic Vaginitis Atrophic vaginitis is a condition in which the tissues that line the vagina become dry and thin. This condition occurs in women who have stopped having their period. It is caused by a drop in a female hormone (estrogen). This hormone helps:  To keep the vagina moist.  To make a clear fluid. This clear fluid helps: ? To make the vagina ready for  sex. ? To protect the vagina from infection. If the lining of the vagina is dry and thin, it may cause irritation, burning, or itchiness. It may also:  Make sex painful.  Make an exam of your vagina painful.  Cause bleeding.  Make you lose interest in sex.  Cause a burning feeling when you pee (urinate).  Cause a brown or yellow fluid to come from your vagina. Some women do not have symptoms. Follow these instructions at home: Medicines  Take over-the-counter and prescription medicines only as told by your doctor.  Do not use herbs or other medicines unless your doctor says it is okay.  Use medicines for for dryness. These include: ? Oils to make the vagina soft. ? Creams. ? Moisturizers. General instructions  Do not douche.  Do not use products that can make your vagina dry. These include: ? Scented sprays. ? Scented tampons. ? Scented soaps.  Sex can help increase blood flow and soften the tissue in the vagina. If it hurts to have sex: ? Tell your partner. ? Use products to make sex more comfortable. Use these only as told by your doctor. Contact a doctor if you:  Have discharge from the vagina that is different than usual.  Have a bad smell coming from your vagina.  Have new symptoms.  Do not get better.  Get worse. Summary  Atrophic vaginitis is a condition in which the lining of the vagina becomes dry and thin.  This condition affects women who have stopped having their periods.  Treatment may include using products that help make the vagina soft.  Call a doctor if do not get better with treatment. This information is not intended to replace advice given to you by your health care provider. Make sure you discuss any questions you have with your health care provider. Document Released: 02/16/2008 Document Revised: 09/12/2017 Document Reviewed: 09/12/2017 Elsevier Interactive Patient Education  2019 [employer].

## 2018-11-28 NOTE — Progress Notes (Signed)
58 y.o. G0P0000 Married  Caucasian Fe here for annual exam.  Menopausal occasional night sweats with no insomnia. No vaginal bleeding or vaginal dryness. Not sexually active mutual agreement. Sees Dr.MacKenzie for hypothyroid management every 6 months and labs, aex. Seeing dermatologist for Psoriasis with medication use. Has follow up US for Thyroid recheck per PCP. No other health issues today.   Patient's last menstrual period was 01/12/2011.          Sexually active: No.  The current method of family planning is post menopausal status.    Exercising: Yes.    run, weights, walk, yoga Smoker:  no  Review of Systems  Constitutional: Negative.   HENT: Negative.   Eyes: Negative.   Respiratory: Negative.   Cardiovascular: Negative.   Gastrointestinal: Negative.   Genitourinary: Negative.   Musculoskeletal: Negative.   Skin: Negative.   Neurological: Negative.   Endo/Heme/Allergies: Negative.   Psychiatric/Behavioral: Negative.     Health Maintenance: Pap:  11-18-15 neg HPV HR neg History of Abnormal Pap: no MMG:  08-28-18 category a density birads 1:neg Self Breast exams: yes Colonoscopy:  9/15 f/u 85yrs has scheduled for 02/2019 BMD:   2018  TDaP:  2017 Shingles: no Pneumonia: no Hep C and HIV: Hep c neg 2017 Labs: PCP   reports that she has never smoked. She has never used smokeless tobacco. She reports current alcohol use of about 4.0 standard drinks of alcohol per week. She reports that she does not use drugs.  Past Medical History:  Diagnosis Date  . Cancer ([place]) 2005   basal cell upper back  . Hypothyroid   . Osteopenia   . Tendonitis     Past Surgical History:  Procedure Laterality Date  . BASAL CELL CARCINOMA EXCISION  2005   left side upper back  . LIPOSUCTION  1996   hips    Current Outpatient Medications  Medication Sig Dispense Refill  . Calcium Citrate-Vitamin D (CALCIUM + D PO) Take 1,200 mg by mouth 2 (two) times daily.    Marland Kitchen levothyroxine  (SYNTHROID, LEVOTHROID) 88 MCG tablet Take 88 mcg by mouth daily.  1  . Multiple Vitamins-Minerals (HAIR/SKIN/NAILS/BIOTIN PO) Take by mouth daily.    . Multiple Vitamins-Minerals (MULTIVITAMIN PO) Take by mouth daily.     No current facility-administered medications for this visit.     Family History  Problem Relation Age of Onset  . Heart disease Mother        bypass  . Stroke Mother        minor stroke 08-19-14  . Cancer Father        Prostate  . Cancer Brother        Brain, Prostate  . Cancer Brother        Liver and Colon  . Breast cancer Paternal Uncle     ROS:  Pertinent items are noted in HPI.  Otherwise, a comprehensive ROS was negative.  Exam:   LMP 01/12/2011    Ht Readings from Last 3 Encounters:  11/22/17 5' 0.25" (1.53 m)  11/18/16 5' 0.25" (1.53 m)  11/18/15 5' 0.25" (1.53 m)    General appearance: alert, cooperative and appears stated age Head: Normocephalic, without obvious abnormality, atraumatic Neck: no adenopathy, supple, symmetrical, trachea midline and thyroid enlarged  Lungs: clear to auscultation bilaterally Breasts: normal appearance, no masses or tenderness, No nipple retraction or dimpling, No nipple discharge or bleeding, No axillary or supraclavicular adenopathy Heart: regular rate and rhythm Abdomen: soft, non-tender; no masses,  no organomegaly Extremities: extremities normal, atraumatic, no cyanosis or edema Skin: Skin color, texture, turgor normal. No rashes or lesions Lymph nodes: Cervical, supraclavicular, and axillary nodes normal. No abnormal inguinal nodes palpated Neurologic: Grossly normal   Pelvic: External genitalia:  no lesions, normal female              Urethra:  normal appearing urethra with no masses, tenderness or lesions              Bartholin's and Skene's: normal                 Vagina: normal appearing vagina with normal color and discharge, no lesions              Cervix: no cervical motion tenderness, no lesions  and retroverted              Pap taken: Yes.   Bimanual Exam:  Uterus:  normal size, contour, position, consistency, mobility, non-tender              Adnexa: normal adnexa and no mass, fullness, tenderness               Rectovaginal: Confirms               Anus:  normal sphincter tone, no lesions  Chaperone present: yes  A:  Well Woman with normal exam  Menopausal no HRT  Atrophic vaginitis  Hypothyroid with MD management  Colonoscopy due has scheduled  P:   Reviewed health and wellness pertinent to exam  Aware of need to advise if vaginal bleeding  Discussed finding and etiology related to menopause changes. Discussed OTC options of Olive Oil and coconut oil if sexually active and several days before next exam for comfort. Questions addressed.  Continue follow up with MD as indicated.  Pap smear: yes   counseled on breast self exam, mammography screening, feminine hygiene, adequate intake of calcium and vitamin D, diet and exercise return annually or prn  An After Visit Summary was printed and given to the patient.

## 2018-11-30 LAB — CYTOLOGY - PAP
Diagnosis: NEGATIVE
HPV (WINDOPATH): NOT DETECTED

## 2019-09-10 ENCOUNTER — Other Ambulatory Visit: Payer: Self-pay

## 2019-09-10 ENCOUNTER — Ambulatory Visit
Admission: RE | Admit: 2019-09-10 | Discharge: 2019-09-10 | Disposition: A | Discharge status: Home / Self Care | Payer: BC Managed Care – PPO | Source: Ambulatory Visit | Attending: Family Medicine | Admitting: Family Medicine

## 2019-09-10 DIAGNOSIS — Z1231 Encounter for screening mammogram for malignant neoplasm of breast: Secondary | ICD-10-CM

## 2019-11-29 ENCOUNTER — Ambulatory Visit: Payer: BC Managed Care – PPO | Admitting: Certified Nurse Midwife

## 2019-12-03 ENCOUNTER — Ambulatory Visit: Payer: BC Managed Care – PPO | Admitting: Certified Nurse Midwife

## 2019-12-03 ENCOUNTER — Other Ambulatory Visit: Payer: Self-pay

## 2019-12-03 ENCOUNTER — Encounter: Payer: Self-pay | Admitting: Certified Nurse Midwife

## 2019-12-03 VITALS — BP 120/78 | HR 68 | Temp 97.3°F | Resp 16 | Ht 60.25 in | Wt 162.0 lb

## 2019-12-03 DIAGNOSIS — Z01419 Encounter for gynecological examination (general) (routine) without abnormal findings: Secondary | ICD-10-CM

## 2019-12-03 DIAGNOSIS — N952 Postmenopausal atrophic vaginitis: Secondary | ICD-10-CM | POA: Diagnosis not present

## 2019-12-03 NOTE — Patient Instructions (Signed)

## 2019-12-03 NOTE — Progress Notes (Signed)
59 y.o. G0P0000 Married  Caucasian Fe here for annual exam. Post menopausal no HRT, no vaginal dryness or vaginal bleeding. Sees PCP yearly exam and hypothyroid management. Has labs planned. Has taken Shingles vaccine, plans Covid vaccine. No other health issues today.  Patient's last menstrual period was 01/12/2011.          Sexually active: No.  The current method of family planning is post menopausal status.    Exercising: Yes.    walking, weights, cardio & yoga Smoker:  no  Review of Systems  Constitutional: Negative.   HENT: Negative.   Eyes: Negative.   Respiratory: Negative.   Cardiovascular: Negative.   Gastrointestinal: Negative.   Genitourinary: Negative.   Musculoskeletal: Negative.   Skin: Negative.   Neurological: Negative.   Endo/Heme/Allergies: Negative.   Psychiatric/Behavioral: Negative.     Health Maintenance: Pap:  11-18-15 neg HPV HR neg, 11-28-2018 neg HPV HR neg History of Abnormal Pap: no MMG:  09-10-2019 category a density birads 1:neg Self Breast exams: yes Colonoscopy:  2020 (care everywhere) f/u 64yrs per patient BMD:   2018 TDaP:  2017 Shingles: 2021 Pneumonia: not done Hep C and HIV: hep c neg 2017 Labs: no   reports that she has never smoked. She has never used smokeless tobacco. She reports current alcohol use of about 4.0 standard drinks of alcohol per week. She reports that she does not use drugs.  Past Medical History:  Diagnosis Date  . Cancer ([place]) 2005   basal cell upper back  . Hypothyroid   . Osteopenia   . Tendonitis   . Thyroid cyst    .84mm    Past Surgical History:  Procedure Laterality Date  . BASAL CELL CARCINOMA EXCISION  2005   left side upper back  . LIPOSUCTION  1996   hips    Current Outpatient Medications  Medication Sig Dispense Refill  . Calcium Citrate-Vitamin D (CALCIUM + D PO) Take 1,200 mg by mouth 2 (two) times daily.    . Cyanocobalamin (B-12 PO) Take by mouth.    . levothyroxine (SYNTHROID,  LEVOTHROID) 88 MCG tablet Take 88 mcg by mouth daily.  1  . Multiple Vitamins-Minerals (HAIR/SKIN/NAILS/BIOTIN PO) Take by mouth daily.    . Multiple Vitamins-Minerals (MULTIVITAMIN PO) Take by mouth daily.     No current facility-administered medications for this visit.    Family History  Problem Relation Age of Onset  . Heart disease Mother        bypass  . Stroke Mother        minor stroke 08-19-14  . Cancer Father        Prostate  . Cancer Brother        Brain, Prostate  . Cancer Brother        Liver and Colon  . Breast cancer Paternal Uncle     ROS:  Pertinent items are noted in HPI.  Otherwise, a comprehensive ROS was negative.  Exam:   BP 120/78   Pulse 68   Temp (!) 97.3 F (36.3 C) (Skin)   Resp 16   Ht 5' 0.25" (1.53 m)   Wt 162 lb (73.5 kg)   LMP 01/12/2011   BMI 31.38 kg/m  Height: 5' 0.25" (153 cm) Ht Readings from Last 3 Encounters:  12/03/19 5' 0.25" (1.53 m)  11/28/18 5' 0.5" (1.537 m)  11/22/17 5' 0.25" (1.53 m)    General appearance: alert, cooperative and appears stated age Head: Normocephalic, without obvious abnormality, atraumatic Neck: no  adenopathy, supple, symmetrical, trachea midline and thyroid slight enlargement  Lungs: clear to auscultation bilaterally Breasts: normal appearance, no masses or tenderness, No nipple retraction or dimpling, No nipple discharge or bleeding, No axillary or supraclavicular adenopathy Heart: regular rate and rhythm Abdomen: soft, non-tender; no masses,  no organomegaly Extremities: extremities normal, atraumatic, no cyanosis or edema Skin: Skin color, texture, turgor normal. No rashes or lesions Lymph nodes: Cervical, supraclavicular, and axillary nodes normal. No abnormal inguinal nodes palpated Neurologic: Grossly normal   Pelvic: External genitalia:  no lesions              Urethra:  normal appearing urethra with no masses, tenderness or lesions              Bartholin's and Skene's: normal                  Vagina: normal appearing vagina with normal color and discharge, no lesions              Cervix: no cervical motion tenderness and no lesions              Pap taken: No. Bimanual Exam:  Uterus:  normal size, contour, position, consistency, mobility, non-tender and anteverted              Adnexa: normal adnexa and no mass, fullness, tenderness               Rectovaginal: Confirms               Anus:  normal sphincter tone, no lesions  Chaperone present: yes  A:  Well Woman with normal exam  Menopausal no HRT  Vaginal dryness  Hypothyroid on medication with PCP management  P:   Reviewed health and wellness pertinent to exam  Aware of need to advise if vaginal bleeding  Discussed coconut oil and Olive Oil 2-3 times a week for dryness  Pap smear: no   counseled on breast self exam, mammography screening, feminine hygiene, adequate intake of calcium and vitamin D, diet and exercise  return annually or prn  An After Visit Summary was printed and given to the patient.

## 2020-02-13 ENCOUNTER — Other Ambulatory Visit: Payer: Self-pay | Admitting: Family Medicine

## 2020-02-13 DIAGNOSIS — Z1231 Encounter for screening mammogram for malignant neoplasm of breast: Secondary | ICD-10-CM

## 2020-09-10 ENCOUNTER — Ambulatory Visit
Admission: RE | Admit: 2020-09-10 | Discharge: 2020-09-10 | Disposition: A | Discharge status: Home / Self Care | Payer: BC Managed Care – PPO | Source: Ambulatory Visit | Attending: Family Medicine | Admitting: Family Medicine

## 2020-09-10 ENCOUNTER — Other Ambulatory Visit: Payer: Self-pay

## 2020-09-10 DIAGNOSIS — Z1231 Encounter for screening mammogram for malignant neoplasm of breast: Secondary | ICD-10-CM

## 2020-09-11 ENCOUNTER — Other Ambulatory Visit: Payer: Self-pay | Admitting: Family Medicine

## 2020-09-11 DIAGNOSIS — Z1231 Encounter for screening mammogram for malignant neoplasm of breast: Secondary | ICD-10-CM

## 2020-09-24 ENCOUNTER — Other Ambulatory Visit: Payer: Self-pay | Admitting: Family Medicine

## 2020-09-24 DIAGNOSIS — Z1231 Encounter for screening mammogram for malignant neoplasm of breast: Secondary | ICD-10-CM

## 2020-12-11 ENCOUNTER — Encounter: Payer: Self-pay | Admitting: Obstetrics and Gynecology

## 2020-12-11 ENCOUNTER — Other Ambulatory Visit: Payer: Self-pay

## 2020-12-11 ENCOUNTER — Ambulatory Visit: Payer: BC Managed Care – PPO | Admitting: Obstetrics and Gynecology

## 2020-12-11 VITALS — BP 142/96 | HR 70 | Ht 61.0 in | Wt 157.0 lb

## 2020-12-11 DIAGNOSIS — Z01419 Encounter for gynecological examination (general) (routine) without abnormal findings: Secondary | ICD-10-CM

## 2020-12-11 NOTE — Patient Instructions (Signed)

## 2020-12-11 NOTE — Progress Notes (Signed)
60 y.o. G60P0000 Married Caucasian female here for annual exam.    No concerns today.   Lives in [place], [place]. Works for a [employer].  Received Shingrix vaccine.  Received Covid booster.   PCP:  Sheppard Evens, MD   Patient's last menstrual period was 01/12/2011.           Sexually active: No.  The current method of family planning is post menopausal status.    Exercising: Yes.    weights, walking and yoga, yardwork  Smoker:  no  Health Maintenance: Pap: 11-28-18 Neg:neg HR HPV, 11-18-15 Neg:Neg HR HPV, 11-13-13 Neg History of abnormal Pap:  no MMG: 09-10-20 3D/Neg/BiRads1 Colonoscopy: 2020;next 5 years per patient BMD: 2018  Result :Osteopenia TDaP:  2017 Gardasil:   no HIV:No Hep C:2017 Neg Screening Labs:  PCP.    reports that she has never smoked. She has never used smokeless tobacco. She reports current alcohol use of about 4.0 standard drinks of alcohol per week. She reports that she does not use drugs.  Past Medical History:  Diagnosis Date  . Cancer ([place]) 2005   basal cell upper back  . Hyperlipidemia   . Hypothyroid   . Osteopenia   . Tendonitis   . Thyroid cyst    .48mm    Past Surgical History:  Procedure Laterality Date  . BASAL CELL CARCINOMA EXCISION  2005   left side upper back  . LIPOSUCTION  1996   hips    Current Outpatient Medications  Medication Sig Dispense Refill  . Calcium Citrate-Vitamin D (CALCIUM + D PO) Take 1,200 mg by mouth 2 (two) times daily.    . Cyanocobalamin (B-12 PO) Take by mouth.    . levothyroxine (SYNTHROID, LEVOTHROID) 88 MCG tablet Take 88 mcg by mouth daily.  1  . Multiple Vitamins-Minerals (HAIR/SKIN/NAILS/BIOTIN PO) Take by mouth daily.    . Multiple Vitamins-Minerals (MULTIVITAMIN PO) Take by mouth daily.    . rosuvastatin (CRESTOR) 5 MG tablet Take 5 mg by mouth daily.     No current facility-administered medications for this visit.    Family History  Problem Relation Age of Onset  . Heart disease Mother         bypass  . Stroke Mother        minor stroke 08-19-14  . Congestive Heart Failure Mother   . Cancer Father        Prostate  . Parkinson's disease Father   . Cancer Brother        Brain, Prostate  . Cancer Brother        Liver and Colon  . Breast cancer Paternal Uncle     Review of Systems  All other systems reviewed and are negative.   Exam:   BP (!) 142/96   Pulse 70   Ht 5\' 1"  (1.549 m)   Wt 157 lb (71.2 kg)   LMP 01/12/2011   SpO2 96%   BMI 29.66 kg/m     General appearance: alert, cooperative and appears stated age Head: normocephalic, without obvious abnormality, atraumatic Neck: no adenopathy, supple, symmetrical, trachea midline and thyroid normal to inspection and palpation Lungs: clear to auscultation bilaterally Breasts: normal appearance, no masses or tenderness, No nipple retraction or dimpling, No nipple discharge or bleeding, No axillary adenopathy Heart: regular rate and rhythm Abdomen: soft, non-tender; no masses, no organomegaly Extremities: extremities normal, atraumatic, no cyanosis or edema Skin: skin color, texture, turgor normal. No rashes or lesions Lymph nodes: cervical, supraclavicular, and axillary  nodes normal. Neurologic: grossly normal  Pelvic: External genitalia:  no lesions              No abnormal inguinal nodes palpated.              Urethra:  normal appearing urethra with no masses, tenderness or lesions              Bartholins and Skenes: normal                 Vagina: normal appearing vagina with normal color and discharge, no lesions              Cervix: no lesions              Pap taken: No. Bimanual Exam:  Uterus:  normal size, contour, position, consistency, mobility, non-tender              Adnexa: no mass, fullness, tenderness              Rectal exam: Yes.  .  Confirms.              Anus:  normal sphincter tone, no lesions  Chaperone was present for exam.  Assessment:   Well woman visit with normal exam. Osteopenia  of spine.  Thyroid cyst.  Hx basal cell CA.  Sees dermatology regularly.   Plan: Mammogram screening discussed. Self breast awareness reviewed. Pap and HR HPV 2025. Guidelines for Calcium, Vitamin D, regular exercise program including cardiovascular and weight bearing exercise. She will pursue potential BMD this year with her PCP in [place].  Labs with PCP.  Follow up annually and prn.

## 2021-09-17 ENCOUNTER — Ambulatory Visit: Payer: BC Managed Care – PPO

## 2021-10-05 ENCOUNTER — Encounter: Payer: Self-pay | Admitting: Obstetrics and Gynecology

## 2021-12-15 ENCOUNTER — Ambulatory Visit (INDEPENDENT_AMBULATORY_CARE_PROVIDER_SITE_OTHER): Payer: BLUE CROSS/BLUE SHIELD | Admitting: Obstetrics and Gynecology

## 2021-12-15 ENCOUNTER — Encounter: Payer: Self-pay | Admitting: Obstetrics and Gynecology

## 2021-12-15 VITALS — BP 122/80 | Ht 60.0 in | Wt 154.0 lb

## 2021-12-15 DIAGNOSIS — Z01419 Encounter for gynecological examination (general) (routine) without abnormal findings: Secondary | ICD-10-CM | POA: Diagnosis not present

## 2021-12-15 NOTE — Progress Notes (Signed)
61 y.o. G31P0000 Married Caucasian female here for annual exam.   ? ?Trying to loose weight.  ? ?Sees dermatology regularly for skin checks. ? ?Husband retired.  ?They just adopted a dog.  ? ?PCP:   Sheppard Evens, MD ? ?Patient's last menstrual period was 01/12/2011.     ?  ?    ?Sexually active: No.  ?The current method of family planning is post menopausal status.    ?Exercising: Yes.    Home exercise routine includes walking 1 1/2 hrs per day. ?Smoker:  no ? ?Health Maintenance: ?Pap:  11/28/2018 - neg, neg HR HPV ?History of abnormal Pap:  no ?MMG:  09/2021 - normal letter by [facility].  See My Chart message.  ?Colonoscopy:  02/21/19 - polyps - due in 5 years  ?BMD:   2018  Result  osteopenia.  Thinks she had this updated in 2022. Thinks it was still osteopenia.  ?TDaP:  09/14/2015 ?Gardasil:   no ?  ?Screening Labs: PCP ? ? reports that she has never smoked. She has never used smokeless tobacco. She reports current alcohol use of about 4.0 standard drinks per week. She reports that she does not use drugs. ? ?Past Medical History:  ?Diagnosis Date  ? Cancer [facility]) 2005  ? basal cell upper back  ? Hyperlipidemia   ? Hypothyroid   ? Osteopenia   ? Tendonitis   ? Thyroid cyst   ? .34m  ? ? ?Past Surgical History:  ?Procedure Laterality Date  ? BASAL CELL CARCINOMA EXCISION  2005  ? left side upper back  ? LIPOSUCTION  1996  ? hips  ? ? ?Current Outpatient Medications  ?Medication Sig Dispense Refill  ? Calcium Citrate-Vitamin D (CALCIUM + D PO) Take 1,200 mg by mouth 2 (two) times daily.    ? Cyanocobalamin (B-12 PO) Take by mouth.    ? levothyroxine (SYNTHROID, LEVOTHROID) 88 MCG tablet Take 88 mcg by mouth daily.  1  ? Multiple Vitamins-Minerals (HAIR/SKIN/NAILS/BIOTIN PO) Take by mouth daily.    ? Multiple Vitamins-Minerals (MULTIVITAMIN PO) Take by mouth daily.    ? rosuvastatin (CRESTOR) 5 MG tablet Take 5 mg by mouth daily.    ? ?No current facility-administered medications for this visit.  ? ? ?Family History   ?Problem Relation Age of Onset  ? Heart disease Mother   ?     bypass  ? Stroke Mother   ?     minor stroke 08-19-14  ? Congestive Heart Failure Mother   ? Cancer Father   ?     Prostate  ? Parkinson's disease Father   ? Cancer Brother   ?     Brain, Prostate  ? Cancer Brother   ?     Liver and Colon  ? Breast cancer Paternal Uncle   ? ? ?Review of Systems  ?All other systems reviewed and are negative. ? ?Exam:   ?BP 122/80 (BP Location: Left Arm, Patient Position: Sitting, Cuff Size: Normal)   Ht 5' (1.524 m)   Wt 154 lb (69.9 kg)   LMP 01/12/2011   BMI 30.08 kg/m?     ?General appearance: alert, cooperative and appears stated age ?Head: normocephalic, without obvious abnormality, atraumatic ?Neck: no adenopathy, supple, symmetrical, trachea midline and thyroid normal to inspection and palpation ?Lungs: clear to auscultation bilaterally ?Breasts: normal appearance, no masses or tenderness, No nipple retraction or dimpling, No nipple discharge or bleeding, No axillary adenopathy ?Heart: regular rate and rhythm ?Abdomen: soft, non-tender; no  masses, no organomegaly ?Extremities: extremities normal, atraumatic, no cyanosis or edema ?Skin: skin color, texture, turgor normal. No rashes or lesions ?Lymph nodes: cervical, supraclavicular, and axillary nodes normal. ?Neurologic: grossly normal ? ?Pelvic: External genitalia:  no lesions ?             No abnormal inguinal nodes palpated. ?             Urethra:  normal appearing urethra with no masses, tenderness or lesions ?             Bartholins and Skenes: normal    ?             Vagina: normal appearing vagina with normal color and discharge, no lesions ?             Cervix: no lesions ?             Pap taken: no ?Bimanual Exam:  Uterus:  normal size, contour, position, consistency, mobility, non-tender ?             Adnexa: no mass, fullness, tenderness ?             Rectal exam: yes.  Confirms. ?             Anus:  normal sphincter tone, no lesions ? ?Chaperone was  present for exam:  Santiago Glad, CMA ? ?Assessment:   ?Well woman visit with gynecologic exam. ?Osteopenia. ?Thyroid cyst.  ?Hypothyroidism.  On Synthroid.  ?Hyperlipidemia.  ?Hx basal cell CA.  ? ?Plan: ?Mammogram screening discussed. ?Self breast awareness reviewed. ?Pap and HR HPV 2025 ?Guidelines for Calcium, Vitamin D, regular exercise program including cardiovascular and weight bearing exercise. ?She will send a copy of her bone density to this office. ?Follow up annually and prn.  ? ?After visit summary provided.  ? ? ? ? ?

## 2021-12-15 NOTE — Patient Instructions (Signed)

## 2021-12-17 ENCOUNTER — Encounter: Payer: Self-pay | Admitting: Obstetrics and Gynecology

## 2022-01-01 NOTE — Telephone Encounter (Signed)
FYI. F/u with pt re: resending Bone Density Results. Pt states that copy was sent to her by her other provider. She states at her earliest convenience she will get hard copy and get it to [place] that way.  ?

## 2022-01-04 ENCOUNTER — Encounter: Payer: Self-pay | Admitting: Obstetrics and Gynecology

## 2022-06-21 IMAGING — MG DIGITAL SCREENING BILAT W/ TOMO W/ CAD
8 series · 8 of 24 positions shown · non-contrast
Comparison: Previous exam(s).

ACR Breast Density Category a: The breast tissue is almost entirely
fatty.

CLINICAL DATA: Screening.

EXAM:
DIGITAL SCREENING BILATERAL MAMMOGRAM WITH TOMO AND CAD

[L MLO synth-2D]
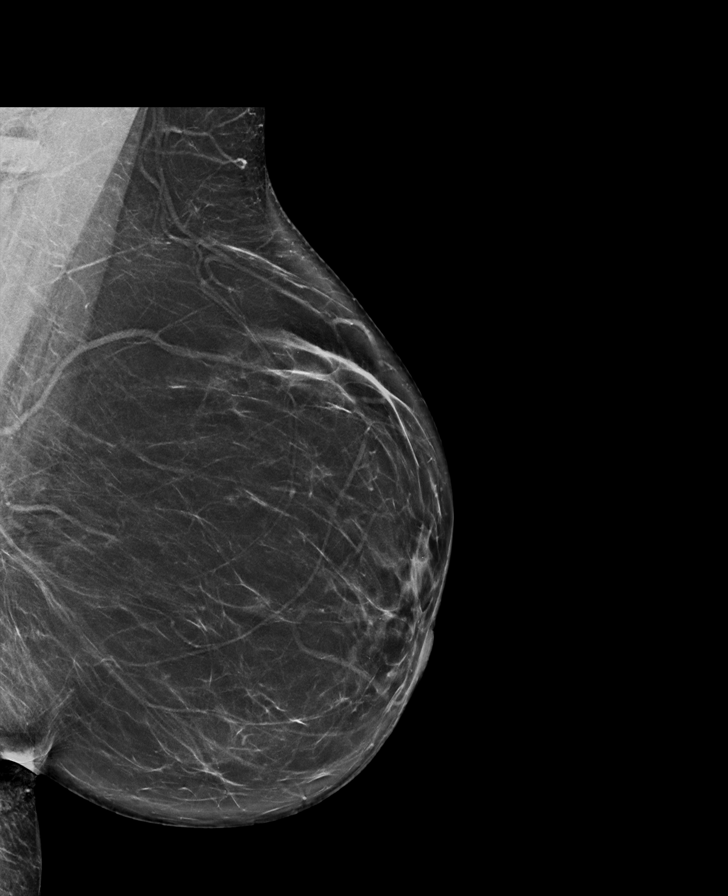

[R CC synth-2D]
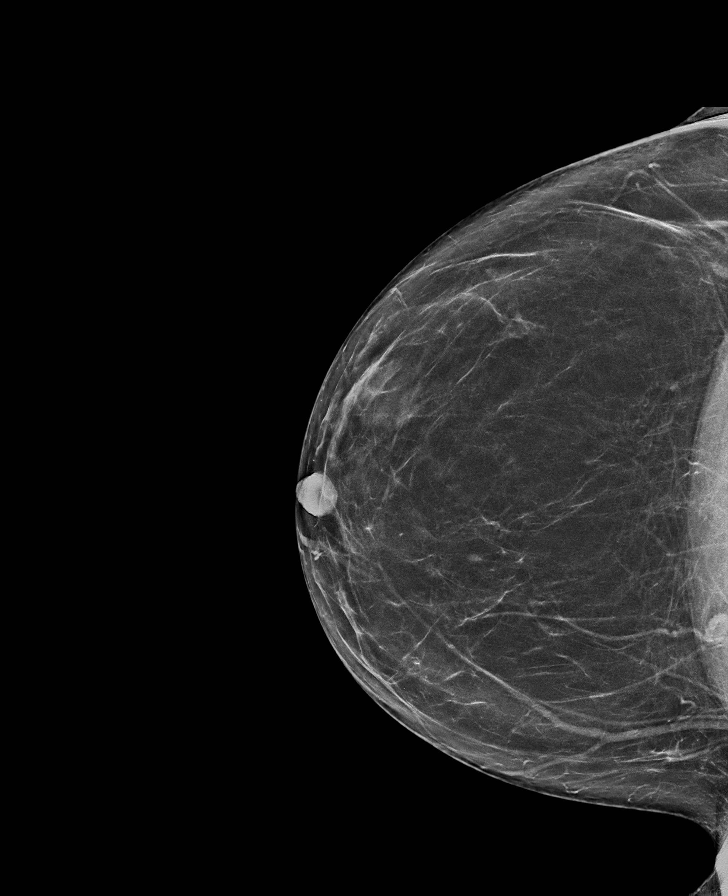

[L CC synth-2D]
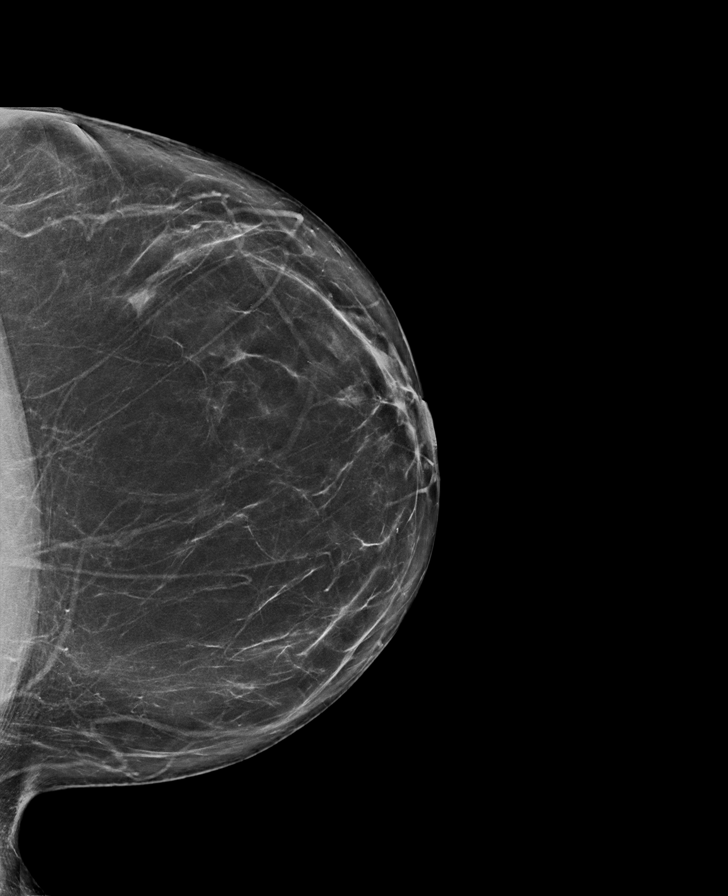

[R MLO synth-2D]
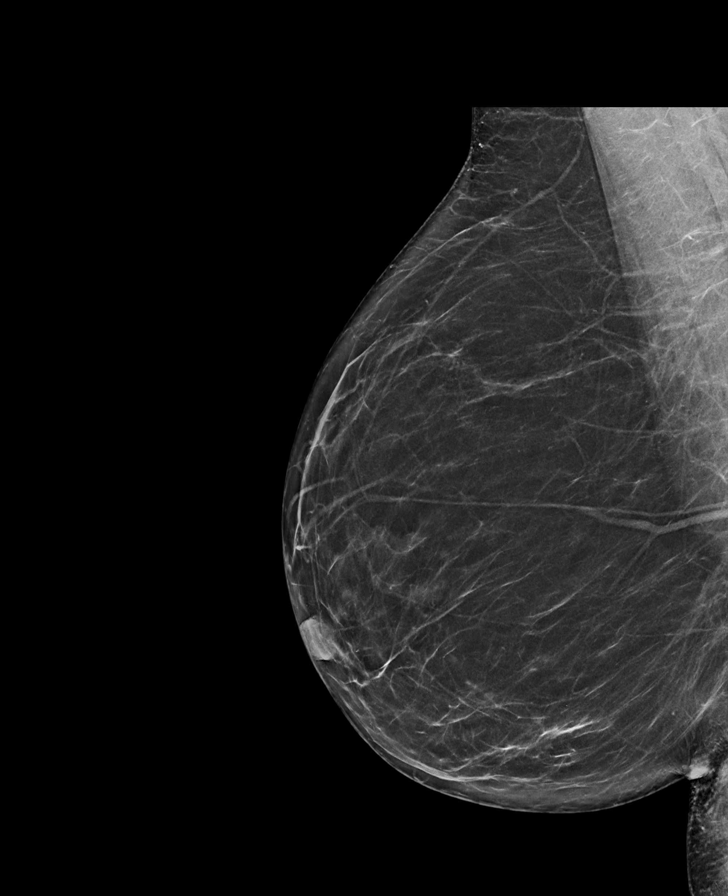

[L CC tomo · tomo slice 39/76.0]
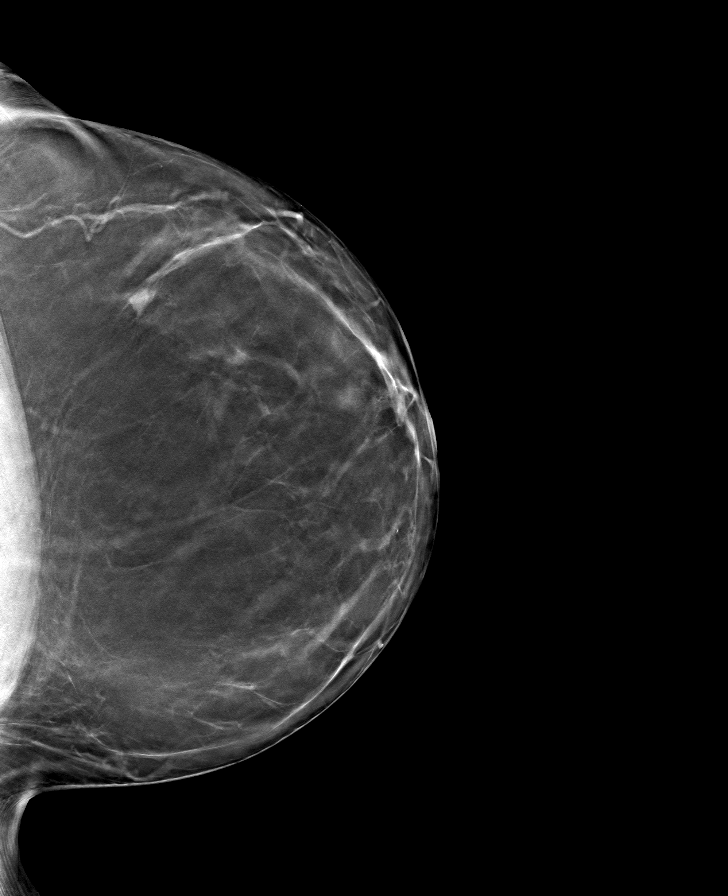

[R CC tomo · tomo slice 37/73.0]
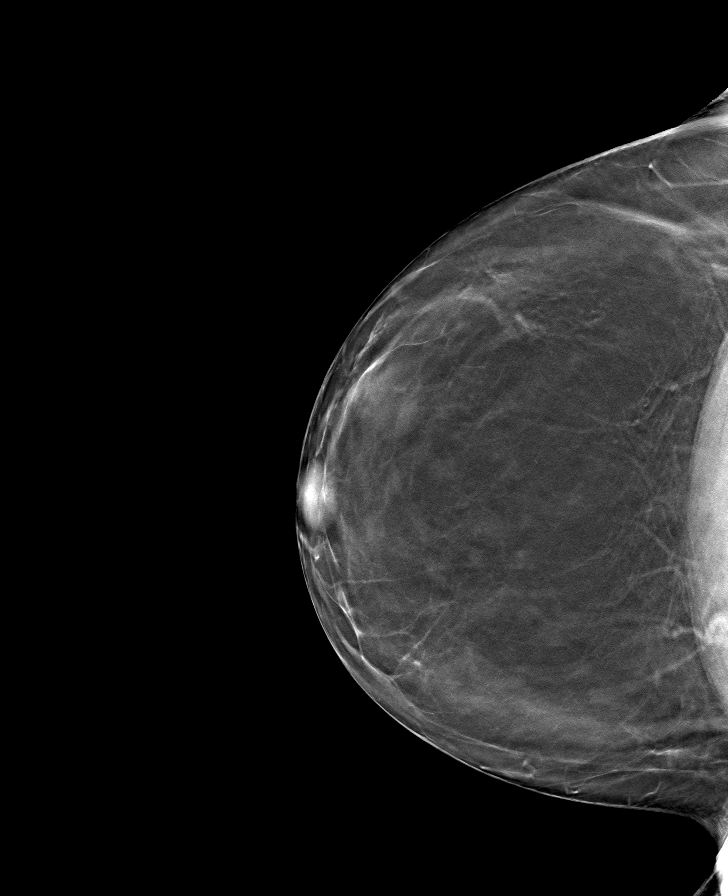

[L MLO tomo · tomo slice 39/78.0]
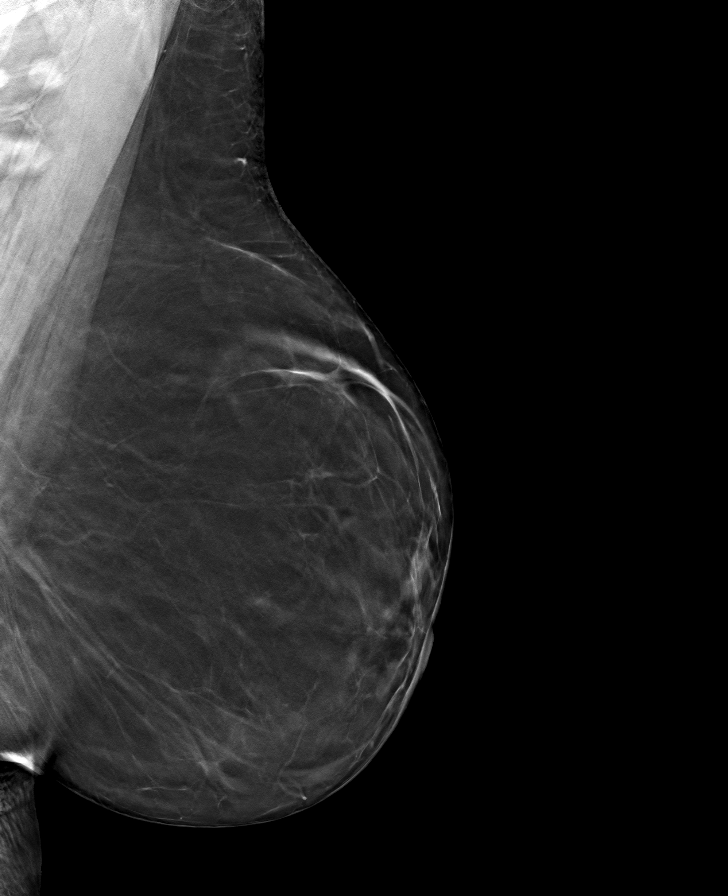

[R MLO tomo · tomo slice 37/73.0]
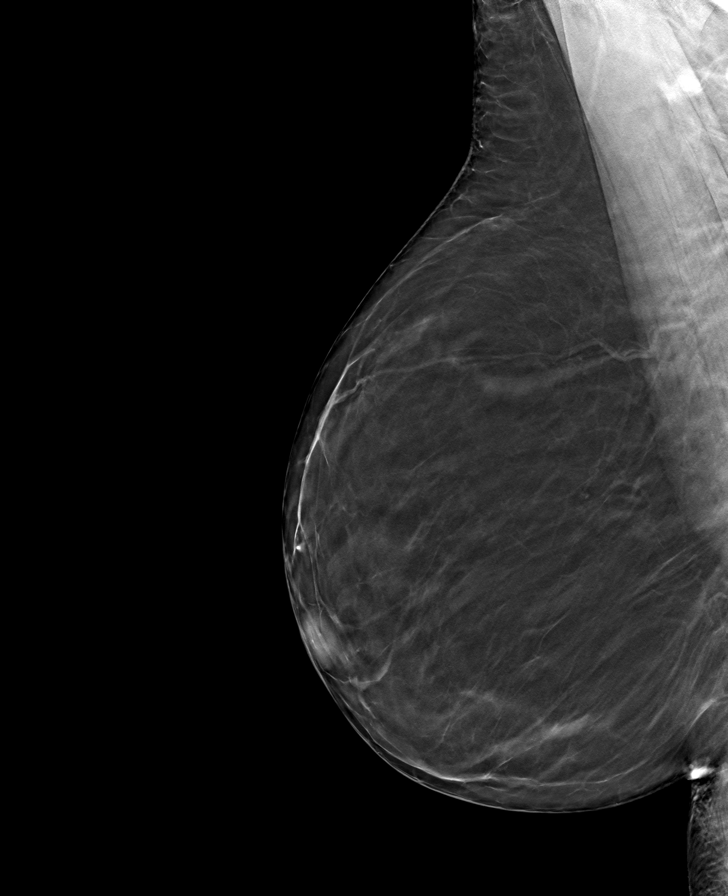

[8 of 24 positions shown; findings below may reference images not displayed]

FINDINGS: There are no findings suspicious for malignancy. Images were
processed with CAD.
IMPRESSION: No mammographic evidence of malignancy. A result letter of this
screening mammogram will be mailed directly to the patient.

RECOMMENDATION:
Screening mammogram in one year. (Code:8Y-[id number])

BI-RADS CATEGORY  1: Negative.

## 2022-10-05 ENCOUNTER — Encounter: Payer: Self-pay | Admitting: Obstetrics and Gynecology

## 2022-12-06 NOTE — Progress Notes (Unsigned)
62 y.o. G0P0000 Married Caucasian female here for annual exam.    Asking about intermittent fasting.   PCP:   Garrel Ridgel, MD  Patient's last menstrual period was 01/12/2011.           Sexually active: No.  The current method of family planning is post menopausal status.    Exercising: Yes.     Kettle bells, walking 2-3x a day, yoga Smoker:  no  Health Maintenance: Pap:  11/28/18 neg: HR HPV neg, 11/13/13 neg: HR HPV neg History of abnormal Pap:  no MMG:  09/23/22 Breast Density Cat B, BI-RADS CAT 2 benign Colonoscopy:  02/21/19 - due in 2025. BMD:   04/27/21  Result  low bone mass TDaP:  09/14/15 Gardasil:   no HIV: n/a Hep C: n/a Screening Labs:  PCP   reports that she has never smoked. She has never used smokeless tobacco. She reports current alcohol use of about 4.0 standard drinks of alcohol per week. She reports that she does not use drugs.  Past Medical History:  Diagnosis Date   Cancer 2005   basal cell upper back   Hyperlipidemia    Hypothyroid    Osteopenia    Tendonitis    Thyroid cyst    .33mm    Past Surgical History:  Procedure Laterality Date   BASAL CELL CARCINOMA EXCISION  2005   left side upper back   LIPOSUCTION  1996   hips    Current Outpatient Medications  Medication Sig Dispense Refill   Calcium Citrate-Vitamin D (CALCIUM + D PO) Take 1,200 mg by mouth 2 (two) times daily.     Cyanocobalamin (B-12 PO) Take by mouth.     levothyroxine (SYNTHROID, LEVOTHROID) 88 MCG tablet Take 88 mcg by mouth daily.  1   liothyronine (CYTOMEL) 5 MCG tablet      Multiple Vitamins-Minerals (HAIR/SKIN/NAILS/BIOTIN PO) Take by mouth daily.     Multiple Vitamins-Minerals (MULTIVITAMIN PO) Take by mouth daily.     rosuvastatin (CRESTOR) 5 MG tablet Take 5 mg by mouth daily.     No current facility-administered medications for this visit.    Family History  Problem Relation Age of Onset   Heart disease Mother        bypass   Stroke Mother        minor stroke  08-19-14   Congestive Heart Failure Mother    Cancer Father        Prostate   Parkinson's disease Father    Cancer Brother        Brain, Prostate   Cancer Brother        Liver and Colon   Breast cancer Paternal Aunt     Review of Systems  All other systems reviewed and are negative.   Exam:   BP 132/86 (BP Location: Left Arm, Patient Position: Sitting, Cuff Size: Normal)   Pulse 72   Ht 5\' 1"  (1.549 m)   Wt 156 lb (70.8 kg)   LMP 01/12/2011   SpO2 98%   BMI 29.48 kg/m     General appearance: alert, cooperative and appears stated age Head: normocephalic, without obvious abnormality, atraumatic Neck: no adenopathy, supple, symmetrical, trachea midline and thyroid normal to inspection and palpation Lungs: clear to auscultation bilaterally Breasts: normal appearance, no masses or tenderness, No nipple retraction or dimpling, No nipple discharge or bleeding, No axillary adenopathy Heart: regular rate and rhythm Abdomen: soft, non-tender; no masses, no organomegaly Extremities: extremities normal, atraumatic, no cyanosis or  edema Skin: skin color, texture, turgor normal. No rashes or lesions Lymph nodes: cervical, supraclavicular, and axillary nodes normal. Neurologic: grossly normal  Pelvic: External genitalia:  no lesions              No abnormal inguinal nodes palpated.              Urethra:  normal appearing urethra with no masses, tenderness or lesions              Bartholins and Skenes: normal                 Vagina: normal appearing vagina with normal color and discharge, no lesions              Cervix: no lesions              Pap taken: {yes no:314532} Bimanual Exam:  Uterus:  normal size, contour, position, consistency, mobility, non-tender              Adnexa: no mass, fullness, tenderness              Rectal exam: {yes no:314532}.  Confirms.              Anus:  normal sphincter tone, no lesions  Chaperone was present for exam:  ***  Assessment:   Well woman  visit with gynecologic exam. Osteopenia. Thyroid cyst.  Hypothyroidism.  On Synthroid and Cytomel.  Hyperlipidemia.  Hx basal cell CA.  FH prostate and colon cancer.   Plan: Mammogram screening discussed. Self breast awareness reviewed. Pap and HR HPV next year. Guidelines for Calcium, Vitamin D, regular exercise program including cardiovascular and weight bearing exercise. BMD due in August, 2024.  [facility] [place], [place]. We discussed genetic counseling and testing.  She declines this.  We reviewed a Mediterranean Diet with portion control and regular vigorous exercise for a heart healthy regimen and weight loss.  Follow up annually and prn.   After visit summary provided.

## 2022-12-20 ENCOUNTER — Ambulatory Visit (INDEPENDENT_AMBULATORY_CARE_PROVIDER_SITE_OTHER): Payer: Self-pay | Admitting: Obstetrics and Gynecology

## 2022-12-20 ENCOUNTER — Encounter: Payer: Self-pay | Admitting: Obstetrics and Gynecology

## 2022-12-20 VITALS — BP 132/86 | HR 72 | Ht 61.0 in | Wt 156.0 lb

## 2022-12-20 DIAGNOSIS — M8580 Other specified disorders of bone density and structure, unspecified site: Secondary | ICD-10-CM

## 2022-12-20 DIAGNOSIS — Z01419 Encounter for gynecological examination (general) (routine) without abnormal findings: Secondary | ICD-10-CM

## 2022-12-20 NOTE — Patient Instructions (Signed)

## 2024-01-17 NOTE — Progress Notes (Unsigned)
 64 y.o. G0P0000 Married Caucasian female here for annual exam.    PCP: Bearl Botts, MD   Patient's last menstrual period was 01/12/2011.           Sexually active: No.  The current method of family planning is post menopausal status.    Menopausal hormone therapy:  n/a Exercising: {yes no:314532}  {types:19826} Smoker:  no  OB History  Gravida Para Term Preterm AB Living  0 0 0 0 0 0  SAB IAB Ectopic Multiple Live Births  0 0 0 0      HEALTH MAINTENANCE: Last 2 paps:  11/28/18 neg HPV neg, 11/13/13 neg  History of abnormal Pap or positive HPV:  no Mammogram:   09/10/20 Breast Density Cat A, BIRADS Cat 1 neg  Colonoscopy: 02/21/19  Bone Density:  04/27/21  Result  ***   Immunization History  Administered Date(s) Administered  . Influenza Inj Mdck Quad Pf 06/05/2018  . Influenza-Unspecified 06/14/2019  . Moderna Sars-Covid-2 Vaccination 01/03/2020, 01/31/2020  . Tdap 09/14/2015  . Zoster Recombinant(Shingrix) 09/27/2019, 11/25/2019      reports that she has never smoked. She has never used smokeless tobacco. She reports current alcohol use of about 4.0 standard drinks of alcohol per week. She reports that she does not use drugs.  Past Medical History:  Diagnosis Date  . Cancer ([facility]) 2005   basal cell upper back  . Hyperlipidemia   . Hypothyroid   . Osteopenia   . Tendonitis   . Thyroid cyst    .4mm    Past Surgical History:  Procedure Laterality Date  . BASAL CELL CARCINOMA EXCISION  2005   left side upper back  . LIPOSUCTION  1996   hips    Current Outpatient Medications  Medication Sig Dispense Refill  . Calcium Citrate-Vitamin D (CALCIUM + D PO) Take 1,200 mg by mouth 2 (two) times daily.    . Cyanocobalamin (B-12 PO) Take by mouth.    . levothyroxine (SYNTHROID, LEVOTHROID) 88 MCG tablet Take 88 mcg by mouth daily.  1  . liothyronine (CYTOMEL) 5 MCG tablet     . Multiple Vitamins-Minerals (HAIR/SKIN/NAILS/BIOTIN PO) Take by mouth daily.    .  Multiple Vitamins-Minerals (MULTIVITAMIN PO) Take by mouth daily.    . rosuvastatin (CRESTOR) 5 MG tablet Take 5 mg by mouth daily.     No current facility-administered medications for this visit.    ALLERGIES: Patient has no known allergies.  Family History  Problem Relation Age of Onset  . Heart disease Mother        bypass  . Stroke Mother        minor stroke 08-19-14  . Congestive Heart Failure Mother   . Cancer Father        Prostate  . Parkinson's disease Father   . Cancer Brother        Brain, Prostate  . Cancer Brother        Liver and Colon  . Breast cancer Paternal Aunt     Review of Systems  PHYSICAL EXAM:  LMP 01/12/2011     General appearance: alert, cooperative and appears stated age Head: normocephalic, without obvious abnormality, atraumatic Neck: no adenopathy, supple, symmetrical, trachea midline and thyroid normal to inspection and palpation Lungs: clear to auscultation bilaterally Breasts: normal appearance, no masses or tenderness, No nipple retraction or dimpling, No nipple discharge or bleeding, No axillary adenopathy Heart: regular rate and rhythm Abdomen: soft, non-tender; no masses, no organomegaly Extremities: extremities  normal, atraumatic, no cyanosis or edema Skin: skin color, texture, turgor normal. No rashes or lesions Lymph nodes: cervical, supraclavicular, and axillary nodes normal. Neurologic: grossly normal  Pelvic: External genitalia:  no lesions              No abnormal inguinal nodes palpated.              Urethra:  normal appearing urethra with no masses, tenderness or lesions              Bartholins and Skenes: normal                 Vagina: normal appearing vagina with normal color and discharge, no lesions              Cervix: no lesions              Pap taken: {yes no:314532} Bimanual Exam:  Uterus:  normal size, contour, position, consistency, mobility, non-tender              Adnexa: no mass, fullness, tenderness               Rectal exam: {yes no:314532}.  Confirms.              Anus:  normal sphincter tone, no lesions  Chaperone was present for exam:  {BSCHAPERONE:31226::"Emily F, CMA"}  ASSESSMENT: Well woman visit with gynecologic exam.  PHQ-9: ***  ***  PLAN: Mammogram screening discussed. Self breast awareness reviewed. Pap and HRV collected:  {yes no:314532} Guidelines for Calcium, Vitamin D, regular exercise program including cardiovascular and weight bearing exercise. Medication refills:  *** {LABS (Optional):23779} Follow up:  ***    Additional counseling given.  {yes [id number]. ***  total time was spent for this patient encounter, including preparation, face-to-face counseling with the patient, coordination of care, and documentation of the encounter in addition to doing the well woman visit with gynecologic exam.

## 2024-01-18 ENCOUNTER — Ambulatory Visit (INDEPENDENT_AMBULATORY_CARE_PROVIDER_SITE_OTHER): Payer: BLUE CROSS/BLUE SHIELD | Admitting: Obstetrics and Gynecology

## 2024-01-18 ENCOUNTER — Other Ambulatory Visit (HOSPITAL_COMMUNITY)
Admission: RE | Admit: 2024-01-18 | Discharge: 2024-01-18 | Disposition: A | Discharge status: Home / Self Care | Source: Ambulatory Visit | Attending: Obstetrics and Gynecology | Admitting: Obstetrics and Gynecology

## 2024-01-18 ENCOUNTER — Encounter: Payer: Self-pay | Admitting: Obstetrics and Gynecology

## 2024-01-18 VITALS — BP 142/90 | HR 68 | Ht 61.0 in | Wt 144.0 lb

## 2024-01-18 DIAGNOSIS — Z1331 Encounter for screening for depression: Secondary | ICD-10-CM

## 2024-01-18 DIAGNOSIS — Z01419 Encounter for gynecological examination (general) (routine) without abnormal findings: Secondary | ICD-10-CM

## 2024-01-18 DIAGNOSIS — Z124 Encounter for screening for malignant neoplasm of cervix: Secondary | ICD-10-CM

## 2024-01-18 NOTE — Patient Instructions (Signed)

## 2024-01-20 LAB — CYTOLOGY - PAP
Comment: NEGATIVE
Diagnosis: NEGATIVE
High risk HPV: NEGATIVE

## 2024-01-22 ENCOUNTER — Encounter: Payer: Self-pay | Admitting: Obstetrics and Gynecology

## 2024-09-30 ENCOUNTER — Encounter: Payer: Self-pay | Admitting: Obstetrics and Gynecology

## 2024-10-02 NOTE — Telephone Encounter (Signed)
 Routing FYI. AEX 02/26/25

## 2025-02-26 ENCOUNTER — Ambulatory Visit: Admitting: Obstetrics and Gynecology
